# Patient Record
Sex: Female | Born: 1995 | Race: Black or African American | Hispanic: No | Marital: Single | Smoking: Never smoker
Health system: Southern US, Community
[De-identification: ages and names within clinical notes are randomized; demographics above are authoritative.]

## PROBLEM LIST (undated history)

## (undated) DIAGNOSIS — I313 Pericardial effusion (noninflammatory): Secondary | ICD-10-CM

## (undated) DIAGNOSIS — D571 Sickle-cell disease without crisis: Secondary | ICD-10-CM

## (undated) DIAGNOSIS — J45909 Unspecified asthma, uncomplicated: Secondary | ICD-10-CM

---
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
## 2016-06-11 ENCOUNTER — Encounter (HOSPITAL_COMMUNITY): Payer: Self-pay | Admitting: Emergency Medicine

## 2016-06-11 ENCOUNTER — Emergency Department (HOSPITAL_COMMUNITY): Payer: Medicaid Other

## 2016-06-11 ENCOUNTER — Emergency Department (HOSPITAL_COMMUNITY)
Admission: EM | Admit: 2016-06-11 | Discharge: 2016-06-11 | Disposition: A | Discharge status: Home / Self Care | Payer: Medicaid Other | Attending: Emergency Medicine | Admitting: Emergency Medicine

## 2016-06-11 DIAGNOSIS — Z79899 Other long term (current) drug therapy: Secondary | ICD-10-CM | POA: Diagnosis not present

## 2016-06-11 DIAGNOSIS — R6889 Other general symptoms and signs: Secondary | ICD-10-CM

## 2016-06-11 DIAGNOSIS — R509 Fever, unspecified: Secondary | ICD-10-CM

## 2016-06-11 DIAGNOSIS — J45909 Unspecified asthma, uncomplicated: Secondary | ICD-10-CM | POA: Insufficient documentation

## 2016-06-11 HISTORY — DX: Unspecified asthma, uncomplicated: J45.909

## 2016-06-11 HISTORY — DX: Sickle-cell disease without crisis: D57.1

## 2016-06-11 LAB — COMPREHENSIVE METABOLIC PANEL
ALT: 15 U/L (ref 14–54)
AST: 28 U/L (ref 15–41)
Albumin: 4.2 g/dL (ref 3.5–5.0)
Alkaline Phosphatase: 69 U/L (ref 38–126)
Anion gap: 10 (ref 5–15)
BUN: 7 mg/dL (ref 6–20)
CO2: 21 mmol/L — ABNORMAL LOW (ref 22–32)
Calcium: 9.3 mg/dL (ref 8.9–10.3)
Chloride: 106 mmol/L (ref 101–111)
Creatinine, Ser: 0.64 mg/dL (ref 0.44–1.00)
GFR calc Af Amer: >60 mL/min (ref >60)
GFR calc non Af Amer: >60 mL/min (ref >60)
Glucose, Bld: 93 mg/dL (ref 65–99)
Potassium: 3.9 mmol/L (ref 3.5–5.1)
Sodium: 137 mmol/L (ref 135–145)
Total Bilirubin: 3.8 mg/dL — ABNORMAL HIGH (ref 0.3–1.2)
Total Protein: 7.2 g/dL (ref 6.5–8.1)

## 2016-06-11 LAB — CBC WITH DIFFERENTIAL/PLATELET
Basophils Absolute: 0 10*3/uL (ref 0.0–0.1)
Basophils Relative: 0 %
Eosinophils Absolute: 0.2 10*3/uL (ref 0.0–0.7)
Eosinophils Relative: 1 %
HCT: 23.4 % — ABNORMAL LOW (ref 36.0–46.0)
Hemoglobin: 8.3 g/dL — ABNORMAL LOW (ref 12.0–15.0)
Lymphocytes Relative: 7 %
Lymphs Abs: 1.7 10*3/uL (ref 0.7–4.0)
MCH: 36.1 pg — ABNORMAL HIGH (ref 26.0–34.0)
MCHC: 35.5 g/dL (ref 30.0–36.0)
MCV: 101.7 fL — ABNORMAL HIGH (ref 78.0–100.0)
Monocytes Absolute: 1.2 10*3/uL — ABNORMAL HIGH (ref 0.1–1.0)
Monocytes Relative: 5 %
Neutro Abs: 20.6 10*3/uL — ABNORMAL HIGH (ref 1.7–7.7)
Neutrophils Relative %: 87 %
Platelets: 502 10*3/uL — ABNORMAL HIGH (ref 150–400)
RBC: 2.3 MIL/uL — ABNORMAL LOW (ref 3.87–5.11)
RDW: 22.7 % — ABNORMAL HIGH (ref 11.5–15.5)
WBC: 23.7 10*3/uL — ABNORMAL HIGH (ref 4.0–10.5)
nRBC: 11 /100 WBC — ABNORMAL HIGH

## 2016-06-11 LAB — RETICULOCYTES
RBC.: 2.3 MIL/uL — ABNORMAL LOW (ref 3.87–5.11)
Retic Count, Absolute: 450.8 10*3/uL — ABNORMAL HIGH (ref 19.0–186.0)
Retic Ct Pct: 19.6 % — ABNORMAL HIGH (ref 0.4–3.1)

## 2016-06-11 MED ORDER — SODIUM CHLORIDE 0.9 % IV BOLUS (SEPSIS)
1000.0000 mL | Freq: Once | INTRAVENOUS | Status: AC
  Administered 2016-06-11: 1000 mL via INTRAVENOUS

## 2016-06-11 MED ORDER — OXYCODONE-ACETAMINOPHEN 5-325 MG PO TABS
2.0000 | ORAL_TABLET | Freq: Once | ORAL | Status: AC
  Administered 2016-06-11: 2 via ORAL
  Filled 2016-06-11: qty 2

## 2016-06-11 MED ORDER — OSELTAMIVIR PHOSPHATE 75 MG PO CAPS: 75.0000 mg | ORAL_CAPSULE | Freq: Two times a day (BID) | ORAL | 0 refills | Status: DC

## 2016-06-11 MED ORDER — KETOROLAC TROMETHAMINE 15 MG/ML IJ SOLN
15.0000 mg | Freq: Once | INTRAMUSCULAR | Status: AC
  Administered 2016-06-11: 15 mg via INTRAVENOUS
  Filled 2016-06-11: qty 1

## 2016-06-11 NOTE — ED Notes (Signed)
PT did not have to use RR at this time  

## 2016-06-11 NOTE — ED Notes (Signed)
PT best friend at bedside

## 2016-06-11 NOTE — ED Triage Notes (Signed)
Aching fever chills and thinks she is having sickle crisis since this am [facility]  And given  2 tylenol less than 1 hours=ago for fever 102

## 2016-06-11 NOTE — ED Provider Notes (Signed)
[facility]-EMERGENCY DEPT Provider Note   CSN: [id number] Arrival date & time: 06/11/16  1237  By signing my name below, I, Sonum Patel, attest that this documentation has been prepared under the direction and in the presence of Raeford RazorStephen Kyrus Hyde, MD. Electronically Signed: Sonum Patel, Neurosurgeoncribe. 06/11/16. 3:01 PM.  History   Chief Complaint Chief Complaint  Patient presents with  . Fever  . Generalized Body Aches  . Sickle Cell Pain Crisis    The history is provided by the patient. No language interpreter was used.     HPI Comments: Lisa Tucker is a 21 y.o. female with past medical history of sickle cel anemia who presents to the Emergency Department complaining of gradual onset, constant, gradually worsened generalized body aches that began this morning. She has associated HA, fever (TMAX 102F), and cough productive of clear to light yellow sputum. She has take Tylenol for the fever but nothing for pain relief. She denies SOB, sore throat, urinary symptoms, joint swelling, rash, nausea, diarrhea. She is followed in [place] for her sickle cell condition; states her hemoglobin typically runs 7-9.   Past Medical History:  Diagnosis Date  . Asthma   . Sickle cell anemia (HCC)     There are no active problems to display for this patient.   No past surgical history on file.  OB History    No data available       Home Medications    Prior to Admission medications   Medication Sig Start Date End Date Taking? Authorizing Provider  albuterol (PROVENTIL HFA;VENTOLIN HFA) 108 (90 Base) MCG/ACT inhaler Inhale 1 puff into the lungs every 6 (six) hours as needed for wheezing or shortness of breath.   Yes Historical Provider, MD  fluticasone-salmeterol (ADVAIR HFA) 115-21 MCG/ACT inhaler Inhale 2 puffs into the lungs 2 (two) times daily.   Yes Historical Provider, MD  folic acid (FOLVITE) 1 MG tablet Take 1 mg by mouth daily.   Yes Historical Provider, MD  hydroxyurea (HYDREA) 500 MG capsule  Take 2,000-2,500 mg by mouth See admin instructions. Pt takes 4 caps (2000mg ) by mouth daily Monday-Friday, takes 5 caps (2500mg ) by mouth Saturday, Sunday.Marland Kitchen.Marland Kitchen.May take with food to minimize GI side effects.   Yes Historical Provider, MD  montelukast (SINGULAIR) 10 MG tablet Take 10 mg by mouth daily.   Yes Historical Provider, MD  penicillin v potassium (VEETID) 250 MG tablet Take 250 mg by mouth 2 (two) times daily.   Yes Historical Provider, MD    Family History No family history on file.  Social History Social History  Substance Use Topics  . Smoking status: Never Smoker  . Smokeless tobacco: Never Used  . Alcohol use Not on file     Allergies   Patient has no known allergies.   Review of Systems Review of Systems  A complete 10 system review of systems was obtained and all systems are negative except as noted in the HPI and PMH.    Physical Exam Updated Vital Signs BP 99/66 (BP Location: Right Arm)   Pulse 105   Temp 100.5 F (38.1 C) (Oral)   Resp 15   Ht 5\' 4"  (1.626 m)   Wt 165 lb 4 oz (75 kg)   LMP 06/11/2016   SpO2 97%   BMI 28.37 kg/m   Physical Exam  Constitutional: She is oriented to person, place, and time. She appears well-developed and well-nourished. No distress.  Non-toxic appearing   HENT:  Head: Normocephalic and atraumatic.  Eyes:  EOM are normal.  Neck: Normal range of motion.  Cardiovascular: Normal rate, regular rhythm and normal heart sounds.   Pulmonary/Chest: Effort normal and breath sounds normal.  Abdominal: Soft. She exhibits no distension. There is no tenderness.  Musculoskeletal: Normal range of motion.  Neurological: She is alert and oriented to person, place, and time.  Skin: Skin is warm and dry.  Psychiatric: She has a normal mood and affect. Judgment normal.  Nursing note and vitals reviewed.    ED Treatments / Results  DIAGNOSTIC STUDIES: Oxygen Saturation is 97% on RA, normal by my interpretation.    COORDINATION OF  CARE: 2:59 PM Discussed treatment plan with pt at bedside and pt agreed to plan.   Labs (all labs ordered are listed, but only abnormal results are displayed) Labs Reviewed  COMPREHENSIVE METABOLIC PANEL - Abnormal; Notable for the following:       Result Value   CO2 21 (*)    Total Bilirubin 3.8 (*)    All other components within normal limits  CBC WITH DIFFERENTIAL/PLATELET - Abnormal; Notable for the following:    WBC 23.7 (*)    RBC 2.30 (*)    Hemoglobin 8.3 (*)    HCT 23.4 (*)    MCV 101.7 (*)    MCH 36.1 (*)    RDW 22.7 (*)    Platelets 502 (*)    nRBC 11 (*)    Neutro Abs 20.6 (*)    Monocytes Absolute 1.2 (*)    All other components within normal limits  RETICULOCYTES - Abnormal; Notable for the following:    Retic Ct Pct 19.6 (*)    RBC. 2.30 (*)    Retic Count, Manual 450.8 (*)    All other components within normal limits    EKG  EKG Interpretation None       Radiology Dg Chest 2 View  Result Date: 06/11/2016 CLINICAL DATA:  Sickle cell disease.  Fever. EXAM: CHEST  2 VIEW COMPARISON:  None. FINDINGS: Heart is upper limits normal in size. Lungs are clear. No effusions or acute bony abnormality. IMPRESSION: No active cardiopulmonary disease. Electronically Signed   By: Charlett Nose M.D.   On: 06/11/2016 13:24    Procedures Procedures (including critical care time)  Medications Ordered in ED Medications - No data to display   Initial Impression / Assessment and Plan / ED Course  I have reviewed the triage vital signs and the nursing notes.  Pertinent labs & imaging results that were available during my care of the patient were reviewed by me and considered in my medical decision making (see chart for details).     Final Clinical Impressions(s) / ED Diagnoses   Final diagnoses:  Fever, unspecified fever cause  Flu-like symptoms    New Prescriptions New Prescriptions   No medications on file   I personally preformed the services scribed in my  presence. The recorded information has been reviewed is accurate. Raeford Razor, MD.    Raeford Razor, MD 06/24/16 [phone]

## 2016-06-11 NOTE — ED Notes (Addendum)
PT did not have to use RR at this time  

## 2017-01-03 ENCOUNTER — Emergency Department (HOSPITAL_COMMUNITY): Payer: Medicaid Other

## 2017-01-03 ENCOUNTER — Inpatient Hospital Stay (HOSPITAL_COMMUNITY)
Admission: EM | Admit: 2017-01-03 | Discharge: 2017-01-05 | DRG: 812 | Disposition: A | Discharge status: Home / Self Care | Payer: Medicaid Other | Attending: Internal Medicine | Admitting: Internal Medicine

## 2017-01-03 ENCOUNTER — Encounter (HOSPITAL_COMMUNITY): Payer: Self-pay | Admitting: *Deleted

## 2017-01-03 DIAGNOSIS — D638 Anemia in other chronic diseases classified elsewhere: Secondary | ICD-10-CM | POA: Diagnosis present

## 2017-01-03 DIAGNOSIS — Z79899 Other long term (current) drug therapy: Secondary | ICD-10-CM | POA: Diagnosis not present

## 2017-01-03 DIAGNOSIS — I313 Pericardial effusion (noninflammatory): Secondary | ICD-10-CM | POA: Diagnosis present

## 2017-01-03 DIAGNOSIS — D5700 Hb-SS disease with crisis, unspecified: Principal | ICD-10-CM | POA: Diagnosis present

## 2017-01-03 DIAGNOSIS — I3139 Other pericardial effusion (noninflammatory): Secondary | ICD-10-CM

## 2017-01-03 HISTORY — DX: Pericardial effusion (noninflammatory): I31.3

## 2017-01-03 LAB — COMPREHENSIVE METABOLIC PANEL
ALT: 18 U/L (ref 14–54)
AST: 42 U/L — ABNORMAL HIGH (ref 15–41)
Albumin: 4.2 g/dL (ref 3.5–5.0)
Alkaline Phosphatase: 63 U/L (ref 38–126)
Anion gap: 9 (ref 5–15)
BILIRUBIN TOTAL: 3.2 mg/dL — AB (ref 0.3–1.2)
BUN: 8 mg/dL (ref 6–20)
CHLORIDE: 107 mmol/L (ref 101–111)
CO2: 24 mmol/L (ref 22–32)
Calcium: 9.2 mg/dL (ref 8.9–10.3)
Creatinine, Ser: 0.49 mg/dL (ref 0.44–1.00)
Glucose, Bld: 93 mg/dL (ref 65–99)
POTASSIUM: 3.9 mmol/L (ref 3.5–5.1)
Sodium: 140 mmol/L (ref 135–145)
TOTAL PROTEIN: 7.6 g/dL (ref 6.5–8.1)

## 2017-01-03 LAB — URINALYSIS, ROUTINE W REFLEX MICROSCOPIC
BACTERIA UA: NONE SEEN
BILIRUBIN URINE: NEGATIVE
Glucose, UA: NEGATIVE mg/dL
Ketones, ur: NEGATIVE mg/dL
Leukocytes, UA: NEGATIVE
Nitrite: NEGATIVE
PH: 7 (ref 5.0–8.0)
Protein, ur: NEGATIVE mg/dL
SPECIFIC GRAVITY, URINE: 1.01 (ref 1.005–1.030)

## 2017-01-03 LAB — CBC WITH DIFFERENTIAL/PLATELET
Basophils Absolute: 0 10*3/uL (ref 0.0–0.1)
Basophils Relative: 0 %
EOS ABS: 1 10*3/uL — AB (ref 0.0–0.7)
Eosinophils Relative: 9 %
HCT: 20.5 % — ABNORMAL LOW (ref 36.0–46.0)
Hemoglobin: 7.8 g/dL — ABNORMAL LOW (ref 12.0–15.0)
LYMPHS ABS: 2.8 10*3/uL (ref 0.7–4.0)
Lymphocytes Relative: 26 %
MCH: 37 pg — AB (ref 26.0–34.0)
MCHC: 38 g/dL — AB (ref 30.0–36.0)
MCV: 97.2 fL (ref 78.0–100.0)
MONO ABS: 0.7 10*3/uL (ref 0.1–1.0)
Monocytes Relative: 6 %
NEUTROS ABS: 6.4 10*3/uL (ref 1.7–7.7)
Neutrophils Relative %: 59 %
PLATELETS: 382 10*3/uL (ref 150–400)
RBC: 2.11 MIL/uL — AB (ref 3.87–5.11)
RDW: 23.8 % — AB (ref 11.5–15.5)
WBC: 10.9 10*3/uL — AB (ref 4.0–10.5)
nRBC: 5 /100 WBC — ABNORMAL HIGH

## 2017-01-03 LAB — TROPONIN I: TROPONIN I: 0.03 ng/mL — AB (ref <0.03)

## 2017-01-03 LAB — RETICULOCYTES: RBC.: 2.11 MIL/uL — AB (ref 3.87–5.11)

## 2017-01-03 LAB — PREGNANCY, URINE: PREG TEST UR: NEGATIVE

## 2017-01-03 MED ORDER — KETOROLAC TROMETHAMINE 30 MG/ML IJ SOLN
30.0000 mg | INTRAMUSCULAR | Status: AC
  Administered 2017-01-03: 30 mg via INTRAVENOUS
  Filled 2017-01-03: qty 1

## 2017-01-03 MED ORDER — HYDROMORPHONE HCL 1 MG/ML IJ SOLN: 1.0000 mg | INTRAMUSCULAR | Status: DC

## 2017-01-03 MED ORDER — SENNOSIDES-DOCUSATE SODIUM 8.6-50 MG PO TABS
1.0000 | ORAL_TABLET | Freq: Two times a day (BID) | ORAL | Status: DC
  Administered 2017-01-03 – 2017-01-05 (×4): 1 via ORAL
  Filled 2017-01-03 (×4): qty 1

## 2017-01-03 MED ORDER — ONDANSETRON HCL 4 MG PO TABS: 4.0000 mg | ORAL_TABLET | ORAL | Status: DC | PRN

## 2017-01-03 MED ORDER — MONTELUKAST SODIUM 10 MG PO TABS
10.0000 mg | ORAL_TABLET | Freq: Every day | ORAL | Status: DC
  Administered 2017-01-04 – 2017-01-05 (×2): 10 mg via ORAL
  Filled 2017-01-03 (×2): qty 1

## 2017-01-03 MED ORDER — HYDROXYUREA 500 MG PO CAPS
2000.0000 mg | ORAL_CAPSULE | ORAL | Status: DC
  Administered 2017-01-04: 2000 mg via ORAL
  Filled 2017-01-03 (×2): qty 4

## 2017-01-03 MED ORDER — PENICILLIN V POTASSIUM 250 MG PO TABS
250.0000 mg | ORAL_TABLET | Freq: Two times a day (BID) | ORAL | Status: DC
  Administered 2017-01-04 – 2017-01-05 (×3): 250 mg via ORAL
  Filled 2017-01-03 (×3): qty 1

## 2017-01-03 MED ORDER — BECLOMETHASONE DIPROPIONATE 80 MCG/ACT IN AERS: 2.0000 | INHALATION_SPRAY | Freq: Two times a day (BID) | RESPIRATORY_TRACT | Status: DC

## 2017-01-03 MED ORDER — DIPHENHYDRAMINE HCL 50 MG/ML IJ SOLN
25.0000 mg | Freq: Once | INTRAMUSCULAR | Status: AC
  Administered 2017-01-03: 25 mg via INTRAVENOUS
  Filled 2017-01-03: qty 1

## 2017-01-03 MED ORDER — SODIUM CHLORIDE 0.45 % IV SOLN
INTRAVENOUS | Status: DC
  Administered 2017-01-03 – 2017-01-05 (×4): via INTRAVENOUS

## 2017-01-03 MED ORDER — HYDROMORPHONE HCL 1 MG/ML IJ SOLN
1.0000 mg | INTRAMUSCULAR | Status: AC
  Administered 2017-01-03: 0.5 mg via INTRAVENOUS
  Filled 2017-01-03: qty 1

## 2017-01-03 MED ORDER — FOLIC ACID 1 MG PO TABS
1.0000 mg | ORAL_TABLET | Freq: Every day | ORAL | Status: DC
  Administered 2017-01-04 – 2017-01-05 (×2): 1 mg via ORAL
  Filled 2017-01-03 (×2): qty 1

## 2017-01-03 MED ORDER — POLYETHYLENE GLYCOL 3350 17 G PO PACK: 17.0000 g | PACK | Freq: Every day | ORAL | Status: DC | PRN

## 2017-01-03 MED ORDER — HYDROMORPHONE HCL 1 MG/ML IJ SOLN: 1.0000 mg | INTRAMUSCULAR | Status: AC

## 2017-01-03 MED ORDER — MORPHINE SULFATE (PF) 2 MG/ML IV SOLN
2.0000 mg | INTRAVENOUS | Status: DC | PRN
  Administered 2017-01-04: 2 mg via INTRAVENOUS
  Filled 2017-01-03: qty 1

## 2017-01-03 MED ORDER — BUDESONIDE 0.5 MG/2ML IN SUSP
0.5000 mg | Freq: Two times a day (BID) | RESPIRATORY_TRACT | Status: DC
  Administered 2017-01-03 – 2017-01-05 (×4): 0.5 mg via RESPIRATORY_TRACT
  Filled 2017-01-03 (×4): qty 2

## 2017-01-03 MED ORDER — ONDANSETRON HCL 4 MG/2ML IJ SOLN: 4.0000 mg | INTRAMUSCULAR | Status: DC | PRN

## 2017-01-03 MED ORDER — DEXTROSE 5 % IV SOLN
1.0000 g | Freq: Once | INTRAVENOUS | Status: AC
  Administered 2017-01-03: 1 g via INTRAVENOUS
  Filled 2017-01-03: qty 10

## 2017-01-03 MED ORDER — HYDROMORPHONE HCL 1 MG/ML IJ SOLN: 0.5000 mg | INTRAMUSCULAR | Status: AC

## 2017-01-03 MED ORDER — ALBUTEROL SULFATE (2.5 MG/3ML) 0.083% IN NEBU: 3.0000 mL | INHALATION_SOLUTION | Freq: Four times a day (QID) | RESPIRATORY_TRACT | Status: DC | PRN

## 2017-01-03 MED ORDER — HYDROMORPHONE HCL 1 MG/ML IJ SOLN
0.5000 mg | INTRAMUSCULAR | Status: AC
  Administered 2017-01-03: 0.5 mg via INTRAVENOUS
  Filled 2017-01-03: qty 1

## 2017-01-03 MED ORDER — KETOROLAC TROMETHAMINE 30 MG/ML IJ SOLN
30.0000 mg | Freq: Four times a day (QID) | INTRAMUSCULAR | Status: DC
  Administered 2017-01-03 – 2017-01-05 (×5): 30 mg via INTRAVENOUS
  Filled 2017-01-03 (×5): qty 1

## 2017-01-03 MED ORDER — HYDROXYUREA 500 MG PO CAPS: 2500.0000 mg | ORAL_CAPSULE | ORAL | Status: DC

## 2017-01-03 MED ORDER — ENOXAPARIN SODIUM 40 MG/0.4ML ~~LOC~~ SOLN
40.0000 mg | Freq: Every day | SUBCUTANEOUS | Status: DC
  Filled 2017-01-03 (×2): qty 0.4

## 2017-01-03 MED ORDER — ONDANSETRON HCL 4 MG/2ML IJ SOLN
4.0000 mg | INTRAMUSCULAR | Status: DC | PRN
  Administered 2017-01-03: 4 mg via INTRAVENOUS
  Filled 2017-01-03: qty 2

## 2017-01-03 NOTE — H&P (Signed)
History and Physical    Lisa Tucker ZOX:[id number] DOB: 05-19-1995 DOA: 01/03/2017  PCP: Alphonse Guildabarrus, Brian Ray, [place] Consultants:  Ronette DeterMcMahon - [facility] [place]; [place] - pul[place]ology; Adriana [place] - cardiology Patient coming from: [employer] [employer] (Student); NOK: Mother, [phone]  Chief Complaint: sickle cell crisis  HPI: Lisa Tucker (goes by "Lisa Tucker") is a 21 y.o. female with medical history significant of sickle cell anemia (SS disease), remote pericardial effusion, and asthma presenting because she was feeling very lethargic, weak, sore, congested.  Some greenish mucus that has turned clear.  Has felt like she was fighting off some kind of infection and like she was going to have a crisis.  Symptoms started yesterday evening.  A typical pain crisis involves a very sharp pain in her lower back, unbearable; crises are also with fever, chills, weakness, fatigue, cough, sneezing, rhinorrhea.  She does not have the pain in her back now but she does have mild joint pain.  She does have a h/o acute chest and sepsis, takes PCN for prohylaxis.  She has worn a Holter in 2015 for pericardial effusion, followed up every 6 months but last f/u was in 2016.  Last Echo was in 2015-2016.  No chest pain, no SOB.  Cough started in the ER and a little bit before that.  Last crisis was in Feb 2018, hospitalizaed [place].  For her usual crisis, she is typically treated with Rocephin and given prn pain medication; she has never needed a PCA and does not take opiates on an ongoing basis.   ED Course: Sickle cell crisis but little pain.  Intermittent cough.  CXR with enlargement of pericardial silhouette and she does have a h/o small pericardial effusion.  Troponin 0.03.  Dr. Mayford Knifeurner called and suggests trending troponin and checking Echo in AM.  Given Rocephin x 1 at patient request for cough.  Review of Systems: As per HPI; otherwise review of systems reviewed and negative.   Ambulatory Status:   Ambulates without assistance  Past Medical History:  Diagnosis Date  . Asthma   . Pericardial effusion 2015  . Sickle cell anemia (HCC)    SS disease; has required [place] O2 for several months twice in the past (2013, 2017)    Past Surgical History:  Procedure Laterality Date  . APPENDECTOMY  2011    Social History   Social History  . Marital status: Single    Spouse name: N/A  . Number of children: N/A  . Years of education: N/A   Occupational History  . College student    Social History Main Topics  . Smoking status: Never Smoker  . Smokeless tobacco: Never Used  . Alcohol use No  . Drug use: No  . Sexual activity: Not on file   Other Topics Concern  . Not on file   Social History Narrative  . No narrative on file    No Known Allergies  Family History  Problem Relation Age of Onset  . Hypertension Mother     Prior to Admission medications   Medication Sig Start Date End Date Taking? Authorizing Provider  albuterol (PROVENTIL HFA;VENTOLIN HFA) 108 (90 Base) MCG/ACT inhaler Inhale 1 puff into the lungs every 6 (six) hours as needed for wheezing or shortness of breath.   Yes [provider]  beclomethasone (QVAR) 80 MCG/ACT inhaler Inhale 2 puffs into the lungs 2 (two) times daily.   Yes [provider]  etonogestrel (NEXPLANON) 68 MG IMPL implant 1  each by Subdermal route once.   Yes [provider]  folic acid (FOLVITE) 1 MG tablet Take 1 mg by mouth daily.   Yes [provider]  hydroxyurea (HYDREA) 500 MG capsule Take 2,000-2,500 mg by mouth See admin instructions. Pt takes 4 caps (2000mg ) by mouth daily Monday-Friday, takes 5 caps (2500mg ) by mouth Saturday, Sunday.Marland KitchenMarland KitchenMay take with food to minimize GI side effects.   Yes [provider]  montelukast (SINGULAIR) 10 MG tablet Take 10 mg by mouth daily.   Yes [provider]  penicillin v potassium (VEETID) 250 MG tablet Take 250 mg by mouth 2 (two) times daily.    Yes [provider]  VITAMIN D, CHOLECALCIFEROL, PO Take 1 tablet by mouth daily. OTC, strength unknown   Yes [provider]  oseltamivir (TAMIFLU) 75 MG capsule Take 1 capsule (75 mg total) by mouth every 12 (twelve) hours. Patient not taking: Reported on 01/03/2017 06/11/16   Raeford Razor, MD    Physical Exam: Vitals:   01/03/17 1930 01/03/17 2000 01/03/17 2055 01/03/17 2233  BP: (!) 103/50 (!) 96/50 (!) 101/53 97/68  Pulse: 66 73 63 63  Resp: 18 (!) 35 16 (!) 21  Temp:      TempSrc:      SpO2: 96% 96% 96% 99%  Weight:      Height:         General:  Appears calm and comfortable and is NAD Eyes:  PERRL, EOMI, normal lids, iris ENT:  grossly normal hearing, lips & tongue, mmm; appropriate dentition Neck:  no LAD, masses or thyromegaly; no carotid bruits Cardiovascular:  RRR, no m/r/g. No LE edema.  Respiratory:   CTA bilaterally with no wheezes/rales/rhonchi.  Normal respiratory effort. Abdomen:  soft, NT, ND, NABS Back:   normal alignment, no CVAT Skin:  no rash or induration seen on limited exam Musculoskeletal:  grossly normal tone BUE/BLE, good ROM, no bony abnormality Lower extremity:  No LE edema.  Limited foot exam with no ulcerations.  2+ distal pulses. Psychiatric:  grossly normal mood and affect, speech fluent and appropriate, AOx3 Neurologic:  CN 2-12 grossly intact, moves all extremities in coordinated fashion, sensation intact    Radiological Exams on Admission: Dg Chest 2 View  Result Date: 01/03/2017 CLINICAL DATA:  Cough, sickle cell anemia EXAM: CHEST  2 VIEW COMPARISON:  06/11/2016 chest radiograph. FINDINGS: New enlargement of the cardiopericardial silhouette. Otherwise normal mediastinal contour. No pneumothorax. No pleural effusion. No acute consolidative airspace disease. No overt pulmonary edema. Visualized osseous structures appear intact. IMPRESSION: New enlargement of the cardiopericardial silhouette, cannot exclude a pericardial  effusion. No active pulmonary disease. Electronically Signed   By: Delbert Phenix M.D.   On: 01/03/2017 17:37    EKG: Independently reviewed.  NSR with rate 76; prolonged QT 505; no evidence of acute ischemia   Labs on Admission: I have personally reviewed the available labs and imaging studies at the time of the admission.  Pertinent labs:   Bilirubin 3.2, prior 3.8 in 2/18 Troponin 0.03 WBC 10.9 Hgb 7.8, prior 8.3 in 2/18 Retic >23 Upreg negative UA small Hgb and otherwise WNL  Assessment/Plan Principal Problem:   Sickle cell crisis (HCC) Active Problems:   Pericardial effusion   Sickle cell crisis -Patient does not present with severe pain with her typical pain crisis and instead often presents with evidence of infection. -Currently with minimally elevated WBC count but no fever and negative CXR and UA so currently low suspicion for infection. -  She was given Rocephin x 1 in the ER but will not continue at this time.   -Hgb still in 7-8 range so there is no indication for transfusion at this time. - Admit to tele bed given probable pericardial effusion (see below). -Start Toradol and prn morphine for pain -continue folic acid and hydroxyurea -IVF: D5-1/2NS at 150 cc/h -Zofran for nausea -If she develops fever or worsening cough, consider blood cultures, repeat CXR, and/or empiric Rocephin. -For now will continue prophylactic PCN.  Pericardial effusion -Patient with reported h/o small effusion in 2016. -She was last seen by cardiology in 7/17 but the actual note is not available in Epic at this time. -CXR today demonstrates possible recurrent enlarged effusion. -Does not demonstrate tamponade physiology-  No tachycardia, borderline hypotension but this is consistent with her BP during her prior ER evaluation in 2/18, no distant heart sounds, no JVP, no pulses paradoxus. -Troponin 0.03. -Dr. Mayford Knife was consulted and suggests trending troponin and having Echo tomorrow. -She  may benefit from cardiology consult after that.  DVT prophylaxis:  Lovenox  Code Status:  Full - confirmed with patient/family Family Communication: Mother present throughout evaluation Disposition Plan:  Home once clinically improved Consults called: Cardiology by telephone  Admission status: Admit - It is my clinical opinion that admission to INPATIENT is reasonable and necessary because this patient will require at least 2 midnights in the hospital to treat this condition based on the medical complexity of the problems presented.  Given the aforementioned information, the predictability of an adverse outcome is felt to be significant.    Jonah Blue MD [facility]  If note is complete, please contact covering daytime or nighttime physician. [email or web address] Password [email or web address]  01/03/2017, 11:26 PM

## 2017-01-03 NOTE — ED Triage Notes (Signed)
Patient is alert and oriented x4.  She is complaining of sickle cell crisis.  Patient states that she presents different than most sickle cell patient.  Patient states that she is feeling weak and lethargic.  Patient denies any pain.

## 2017-01-03 NOTE — ED Notes (Signed)
Pickering MD made aware. Only 0.5mg  given per pt request.

## 2017-01-03 NOTE — ED Provider Notes (Signed)
[facility]-EMERGENCY DEPT Provider Note   CSN: [id number] Arrival date & time: 01/03/17  1350     History   Chief Complaint Chief Complaint  Patient presents with  . Sickle Cell Pain Crisis    HPI Lisa Tucker is a 21 y.o. female.  HPI Patient has sickle cell [place]. Flares up around twice a year. Says that she thinks she is flaring up now due to the change in weather. States she hurts mostly in her joints. Has not had fever. Slight URI symptoms. No nausea vomiting. States she aches in her joints. No chest pain. States she has some weakness and fatigue. Also has a dull headache. She is on hydroxyurea. She has oxycodone that she takes as needed home and normally does not have to take. Denies possibility of pregnancy. States that she has had good oral intake at home his been trying to drink a lot of water. Past Medical History:  Diagnosis Date  . Asthma   . Sickle cell anemia (HCC)     There are no active problems to display for this patient.   Past Surgical History:  Procedure Laterality Date  . APPENDECTOMY      OB History    No data available       Home Medications    Prior to Admission medications   Medication Sig Start Date End Date Taking? Authorizing Provider  albuterol (PROVENTIL HFA;VENTOLIN HFA) 108 (90 Base) MCG/ACT inhaler Inhale 1 puff into the lungs every 6 (six) hours as needed for wheezing or shortness of breath.   Yes [provider]  beclomethasone (QVAR) 80 MCG/ACT inhaler Inhale 2 puffs into the lungs 2 (two) times daily.   Yes [provider]  etonogestrel (NEXPLANON) 68 MG IMPL implant 1 each by Subdermal route once.   Yes [provider]  folic acid (FOLVITE) 1 MG tablet Take 1 mg by mouth daily.   Yes [provider]  hydroxyurea (HYDREA) 500 MG capsule Take 2,000-2,500 mg by mouth See admin instructions. Pt takes 4 caps (2000mg ) by mouth daily Monday-Friday, takes 5 caps (2500mg ) by mouth Saturday, Sunday.Marland KitchenMarland KitchenMay take with  food to minimize GI side effects.   Yes [provider]  montelukast (SINGULAIR) 10 MG tablet Take 10 mg by mouth daily.   Yes [provider]  penicillin v potassium (VEETID) 250 MG tablet Take 250 mg by mouth 2 (two) times daily.   Yes [provider]  VITAMIN D, CHOLECALCIFEROL, PO Take 1 tablet by mouth daily. OTC, strength unknown   Yes [provider]  oseltamivir (TAMIFLU) 75 MG capsule Take 1 capsule (75 mg total) by mouth every 12 (twelve) hours. Patient not taking: Reported on 01/03/2017 06/11/16   Raeford Razor, MD    Family History History reviewed. No pertinent family history.  Social History Social History  Substance Use Topics  . Smoking status: Never Smoker  . Smokeless tobacco: Never Used  . Alcohol use No     Allergies   Patient has no known allergies.   Review of Systems Review of Systems  Constitutional: Positive for appetite change and fatigue. Negative for fever.  HENT: Positive for congestion.   Respiratory: Positive for cough. Negative for chest tightness.   Cardiovascular: Negative for chest pain.  Gastrointestinal: Negative for abdominal pain.  Genitourinary: Negative for flank pain.  Musculoskeletal: Positive for arthralgias. Negative for back pain.  Skin: Negative for rash.  Neurological: Negative for tremors.  Hematological: Negative for adenopathy.  Psychiatric/Behavioral: Negative for confusion.  Physical Exam Updated Vital Signs BP (!) 103/50   Pulse 66   Temp 98.6 F (37 C) (Oral)   Resp 18   Ht 5\' 4"  (1.626 m)   Wt 74.8 kg (165 lb)   LMP 12/20/2016   SpO2 96%   BMI 28.32 kg/m   Physical Exam  Constitutional: She appears well-developed and well-nourished.  HENT:  Head: Atraumatic.  Neck: Neck supple.  Cardiovascular: Normal rate.   Pulmonary/Chest: Effort normal. She has no wheezes. She has no rales.  Abdominal: Soft. There is no tenderness.  Musculoskeletal: She exhibits no edema.    Neurological: She is alert.  Skin: Skin is warm. Capillary refill takes less than 2 seconds.  Psychiatric: She has a normal mood and affect.     ED Treatments / Results  Labs (all labs ordered are listed, but only abnormal results are displayed) Labs Reviewed  CBC WITH DIFFERENTIAL/PLATELET - Abnormal; Notable for the following:       Result Value   WBC 10.9 (*)    RBC 2.11 (*)    Hemoglobin 7.8 (*)    HCT 20.5 (*)    MCH 37.0 (*)    MCHC 38.0 (*)    RDW 23.8 (*)    All other components within normal limits  COMPREHENSIVE METABOLIC PANEL - Abnormal; Notable for the following:    AST 42 (*)    Total Bilirubin 3.2 (*)    All other components within normal limits  RETICULOCYTES - Abnormal; Notable for the following:    Retic Ct Pct >23.0 (*)    RBC. 2.11 (*)    All other components within normal limits  URINALYSIS, ROUTINE W REFLEX MICROSCOPIC - Abnormal; Notable for the following:    Hgb urine dipstick SMALL (*)    Squamous Epithelial / LPF 0-5 (*)    All other components within normal limits  TROPONIN I - Abnormal; Notable for the following:    Troponin I 0.03 (*)    All other components within normal limits  PREGNANCY, URINE    EKG  EKG Interpretation  Date/Time:  Sunday January 03 2017 18:20:57 EDT Ventricular Rate:  76 PR Interval:    QRS Duration: 100 QT Interval:  449 QTC Calculation: 505 R Axis:   83 Text Interpretation:  Sinus rhythm Prolonged QT interval Confirmed by Benjiman Core [phone]) on 01/03/2017 7:42:30 PM       Radiology Dg Chest 2 View  Result Date: 01/03/2017 CLINICAL DATA:  Cough, sickle cell anemia EXAM: CHEST  2 VIEW COMPARISON:  06/11/2016 chest radiograph. FINDINGS: New enlargement of the cardiopericardial silhouette. Otherwise normal mediastinal contour. No pneumothorax. No pleural effusion. No acute consolidative airspace disease. No overt pulmonary edema. Visualized osseous structures appear intact. IMPRESSION: New enlargement of  the cardiopericardial silhouette, cannot exclude a pericardial effusion. No active pulmonary disease. Electronically Signed   By: Delbert Phenix M.D.   On: 01/03/2017 17:37    Procedures Procedures (including critical care time)  Medications Ordered in ED Medications  0.45 % sodium chloride infusion ( Intravenous New Bag/Given 01/03/17 1723)  HYDROmorphone (DILAUDID) injection 1 mg (0 mg Intravenous Hold 01/03/17 1911)    Or  HYDROmorphone (DILAUDID) injection 1 mg ( Subcutaneous See Alternative 01/03/17 1911)  HYDROmorphone (DILAUDID) injection 1 mg (not administered)    Or  HYDROmorphone (DILAUDID) injection 1 mg (not administered)  ondansetron (ZOFRAN) injection 4 mg (4 mg Intravenous Given 01/03/17 1719)  cefTRIAXone (ROCEPHIN) 1 g in dextrose 5 % 50 mL IVPB (not administered)  ketorolac (TORADOL) 30 MG/ML injection 30 mg (30 mg Intravenous Given 01/03/17 1719)  HYDROmorphone (DILAUDID) injection 0.5 mg (0.5 mg Intravenous Given 01/03/17 1719)    Or  HYDROmorphone (DILAUDID) injection 0.5 mg ( Subcutaneous See Alternative 01/03/17 1719)  HYDROmorphone (DILAUDID) injection 1 mg (0.5 mg Intravenous Given 01/03/17 1817)    Or  HYDROmorphone (DILAUDID) injection 1 mg ( Subcutaneous See Alternative 01/03/17 1817)  diphenhydrAMINE (BENADRYL) injection 25 mg (25 mg Intravenous Given 01/03/17 1738)     Initial Impression / Assessment and Plan / ED Course  I have reviewed the triage vital signs and the nursing notes.  Pertinent labs & imaging results that were available during my care of the patient were reviewed by me and considered in my medical decision making (see chart for details).     Patient with sickle cell pain crisis. Presents with joint pain which is sometimes her symptoms but not most common symptoms. Has had occasional cough but no real chest pain. Does have on the x-ray and new enlargement of her pericardial silhouette. Has had previous pericardial effusion but his been small. Last x-ray we  have here was from 7 months ago. Troponin is minimally elevated at 0.03. Discussed with Dr. Mayford Knifeurner from cardiology. Thinks that the patient would benefit from serial cardiac enzymes and cardiac echo tomorrow. Patient has had a cough without real sputum production but oriented acute chest. Requested Rocephin) think it is reasonable. Will admit to hospitalist. patient is worried about pneumonia  Final Clinical Impressions(s) / ED Diagnoses   Final diagnoses:  Sickle cell pain crisis (HCC)  Pericardial effusion    New Prescriptions New Prescriptions   No medications on file     Benjiman CorePickering, Mckinzey Entwistle, MD 01/03/17 2012

## 2017-01-03 NOTE — Progress Notes (Addendum)
Patient arrived to floor from Emergency Room.  Patient was settled in bed.  She had no complaints and vitals/condition was stable.  About 45 minutes after patient arrived to floor, telemetry orders were placed.  Will give report to tele floor once patient has a bed. Priscille KluverLineback, Maleena Eddleman M

## 2017-01-04 ENCOUNTER — Inpatient Hospital Stay (HOSPITAL_COMMUNITY): Payer: Medicaid Other

## 2017-01-04 DIAGNOSIS — I313 Pericardial effusion (noninflammatory): Secondary | ICD-10-CM

## 2017-01-04 DIAGNOSIS — D5700 Hb-SS disease with crisis, unspecified: Principal | ICD-10-CM

## 2017-01-04 DIAGNOSIS — D638 Anemia in other chronic diseases classified elsewhere: Secondary | ICD-10-CM

## 2017-01-04 LAB — TROPONIN I: Troponin I: <0.03 ng/mL (ref <0.03)

## 2017-01-04 LAB — CBC
HCT: 16.8 % — ABNORMAL LOW (ref 36.0–46.0)
Hemoglobin: 6.5 g/dL — CL (ref 12.0–15.0)
MCH: 36.9 pg — ABNORMAL HIGH (ref 26.0–34.0)
MCHC: 38.7 g/dL — ABNORMAL HIGH (ref 30.0–36.0)
MCV: 95.5 fL (ref 78.0–100.0)
Platelets: 286 10*3/uL (ref 150–400)
RBC: 1.76 MIL/uL — AB (ref 3.87–5.11)
RDW: 23.2 % — AB (ref 11.5–15.5)
WBC: 10.1 10*3/uL (ref 4.0–10.5)

## 2017-01-04 LAB — RETICULOCYTES: RBC.: 1.74 MIL/uL — ABNORMAL LOW (ref 3.87–5.11)

## 2017-01-04 LAB — DIFFERENTIAL
Basophils Absolute: 0.1 10*3/uL (ref 0.0–0.1)
Basophils Relative: 1 %
Eosinophils Absolute: 1.1 10*3/uL — ABNORMAL HIGH (ref 0.0–0.7)
Eosinophils Relative: 11 %
LYMPHS ABS: 2.6 10*3/uL (ref 0.7–4.0)
Lymphocytes Relative: 26 %
MONOS PCT: 5 %
Monocytes Absolute: 0.5 10*3/uL (ref 0.1–1.0)
NEUTROS ABS: 5.8 10*3/uL (ref 1.7–7.7)
NEUTROS PCT: 57 %

## 2017-01-04 LAB — ECHOCARDIOGRAM COMPLETE
HEIGHTINCHES: 64 in
Weight: 2673.74 oz

## 2017-01-04 LAB — ABO/RH: ABO/RH(D): O POS

## 2017-01-04 LAB — PREPARE RBC (CROSSMATCH)

## 2017-01-04 LAB — MRSA PCR SCREENING: MRSA BY PCR: NEGATIVE

## 2017-01-04 MED ORDER — SODIUM CHLORIDE 0.9 % IV SOLN
Freq: Once | INTRAVENOUS | Status: AC
  Administered 2017-01-04: 12:00:00 via INTRAVENOUS

## 2017-01-04 MED ORDER — ASPIRIN-ACETAMINOPHEN-CAFFEINE 250-250-65 MG PO TABS
1.0000 | ORAL_TABLET | Freq: Four times a day (QID) | ORAL | Status: DC | PRN
  Administered 2017-01-04 – 2017-01-05 (×2): 1 via ORAL
  Filled 2017-01-04 (×3): qty 1

## 2017-01-04 NOTE — Progress Notes (Signed)
SICKLE CELL SERVICE PROGRESS NOTE  Lisa Tucker UJW:[id number] DOB: 1995/12/09 DOA: 01/03/2017 PCP: Alphonse Guildabarrus, Brian Ray, MD  Assessment/Plan: Principal Problem:   Sickle cell crisis [facility](HCC) Active Problems:   Pericardial effusion  1. Hb SS with Vaso-occlusive Crisis: Pt is without pain at present. Will continue Toradol and prescribe Oxycodone on a PRN basis. Continue IVF at current rate. 2. Migraine HA: Resolved with Excedrin.  3. Anemia of Chronic Disease:baseline Hb is 8 g/dl. The decrease in Hb is likely from dilution and she has a robust reticulocytosis. She received a transfusion of 1 unit RBC's ordered overnight by Dr. Alvino Chapelhoi.   No indication for further transfusion at this time. Continue Hydrea.  4. Enlarged Cardiac Silhouette: Pt is asymptomatic and has no clinical indication that she has an increased pericardial effusion. ECHO completed and results pending at this time. No clinical or EKG signs of pericarditis or Tamponade.  5. H/O Asthma: quiescent at present. 6. DVT Prophylaxis: Lovenox   Code Status: Full Code Family Communication: Mother at bedside and all questions answered.  Disposition Plan: Not yet ready for discharge  Robby Bulkley A.  Pager [phone]. If 7PM-7AM, please contact night-coverage.  01/04/2017, 2:13 PM  LOS: 1 day   Interim History: Pt denies any pain at this time. She has had no SOB or DOE and denies having any chest pain.  She did c/o HA ans was treated with Excedrin Migraine after I spoke with her mother who confirmed that all her TCD's were normal in the past. HA is resolved at present.   Consultants:  None  Procedures:  None  Antibiotics:  Rocephin 9/2 >>9/2    Objective: Vitals:   01/04/17 0732 01/04/17 0758 01/04/17 1120 01/04/17 1137  BP:  (!) 108/57 (!) 113/57 (!) 111/52  Pulse:  77 81 78  Resp:  16 16 16   Temp:  98.6 F (37 C) 98.4 F (36.9 C) 98.7 F (37.1 C)  TempSrc:  Oral Oral Oral  SpO2: 98% 99% 98% 98%  Weight:       Height:       Weight change:   Intake/Output Summary (Last 24 hours) at 01/04/17 1413 Last data filed at 01/03/17 2203  Gross per 24 hour  Intake               50 ml  Output                0 ml  Net               50 ml     Physical Exam General: Alert, awake, oriented x3, in no acute distress. Well appearing. HEENT: [place]/AT PEERL, EOMI, anicteric Neck: Trachea midline,  no masses, no thyromegal,y no JVD, no carotid bruit OROPHARYNX:  Moist, No exudate/ erythema/lesions.  Heart: Regular rate and rhythm, no murmurs, rubs, gallops, PMI non-displaced, no heaves or thrills on palpation.  Lungs: Clear to auscultation, no wheezing or rhonchi noted. No increased vocal fremitus resonant to percussion  Abdomen: Soft, nontender, nondistended, positive bowel sounds, no masses no hepatosplenomegaly noted..  Neuro: No focal neurological deficits noted cranial nerves II through XII grossly intact. Strength at functional baseline in bilateral upper and lower extremities. Musculoskeletal: No warmth swelling or erythema around joints, no spinal tenderness noted. Psychiatric: Patient alert and oriented x3, good insight and cognition, good recent to remote recall. Lymph node survey: No cervical axillary or inguinal lymphadenopathy noted.    Data Reviewed: Basic Metabolic Panel:  Recent Labs Lab 01/03/17 1720  NA 140  K 3.9  CL 107  CO2 24  GLUCOSE 93  BUN 8  CREATININE 0.49  CALCIUM 9.2   Liver Function Tests:  Recent Labs Lab 01/03/17 1720  AST 42*  ALT 18  ALKPHOS 63  BILITOT 3.2*  PROT 7.6  ALBUMIN 4.2   No results for input(s): LIPASE, AMYLASE in the last 168 hours. No results for input(s): AMMONIA in the last 168 hours. CBC:  Recent Labs Lab 01/03/17 1720 01/04/17 0636  WBC 10.9* 10.1  NEUTROABS 6.4 5.8  HGB 7.8* 6.5*  HCT 20.5* 16.8*  MCV 97.2 95.5  PLT 382 286   Cardiac Enzymes:  Recent Labs Lab 01/03/17 1720 01/04/17 0024 01/04/17 0636  TROPONINI 0.03*  <0.03 <0.03   BNP (last 3 results) No results for input(s): BNP in the last 8760 hours.  ProBNP (last 3 results) No results for input(s): PROBNP in the last 8760 hours.  CBG: No results for input(s): GLUCAP in the last 168 hours.  Recent Results (from the past 240 hour(s))  MRSA PCR Screening     Status: None   Collection Time: 01/03/17 11:13 PM  Result Value Ref Range Status   MRSA by PCR NEGATIVE NEGATIVE Final    Comment:        The GeneXpert MRSA Assay (FDA approved for NASAL specimens only), is one component of a comprehensive MRSA colonization surveillance program. It is not intended to diagnose MRSA infection nor to guide or monitor treatment for MRSA infections.      Studies: Dg Chest 2 View  Result Date: 01/03/2017 CLINICAL DATA:  Cough, sickle cell anemia EXAM: CHEST  2 VIEW COMPARISON:  06/11/2016 chest radiograph. FINDINGS: New enlargement of the cardiopericardial silhouette. Otherwise normal mediastinal contour. No pneumothorax. No pleural effusion. No acute consolidative airspace disease. No overt pulmonary edema. Visualized osseous structures appear intact. IMPRESSION: New enlargement of the cardiopericardial silhouette, cannot exclude a pericardial effusion. No active pulmonary disease. Electronically Signed   By: Delbert Phenix M.D.   On: 01/03/2017 17:37    Scheduled Meds: . budesonide (PULMICORT) nebulizer solution  0.5 mg Nebulization BID  . enoxaparin (LOVENOX) injection  40 mg Subcutaneous QHS  . folic acid  1 mg Oral Daily  . hydroxyurea  2,000 mg Oral Once per day on Mon Tue Wed Thu Fri  . [START ON 01/09/2017] hydroxyurea  2,500 mg Oral Once per day on Sun Sat  . ketorolac  30 mg Intravenous Q6H  . montelukast  10 mg Oral Daily  . penicillin v potassium  250 mg Oral BID  . senna-docusate  1 tablet Oral BID   Continuous Infusions: . sodium chloride 75 mL/hr at 01/04/17 1122    Principal Problem:   Sickle cell crisis (HCC) Active Problems:    Pericardial effusion   In excess of 35 minutes spent during this visit. Greater than 50% involved face to face contact with the patient for assessment, counseling and coordination of care.

## 2017-01-04 NOTE — Progress Notes (Signed)
CRITICAL VALUE ALERT  Critical Value:  Hemoglobin 6.5  Date & Time Notied: 9/3 0730  Provider Notified: Dr. Alvino Chapelhoi  Orders Received/Actions taken: 1 unit prbc's

## 2017-01-04 NOTE — Care Management Note (Signed)
Case Management Note  Patient Details  Name: Lisa Tucker MRN: [id number] Date of Birth: 1995/05/30  Subjective/Objective:      Sickle cell crisis              Action/Plan: Date:  January 04, 2017 Chart reviewed for concurrent status and case management needs. Will continue to follow patient progress. Discharge Planning: following for needs Expected discharge date: [id number] Marcelle SmilingRhonda Davis, BSN, [place]3, [place]   [phone]  Expected Discharge Date:   (unknown)               Expected Discharge Plan:  Home/Self Care  In-House Referral:     Discharge planning Services  CM Consult  Post Acute Care Choice:    Choice offered to:     DME Arranged:    DME Agency:     HH Arranged:    HH Agency:     Status of Service:  In process, will continue to follow  If discussed at Long Length of Stay Meetings, dates discussed:    Additional Comments:  Golda AcreDavis, Rhonda Lynn, RN 01/04/2017, 9:04 AM

## 2017-01-04 NOTE — Progress Notes (Signed)
  Echocardiogram 2D Echocardiogram has been performed.  Nolon RodBrown, Tony 01/04/2017, 10:15 AM

## 2017-01-05 ENCOUNTER — Encounter: Payer: Self-pay | Admitting: Internal Medicine

## 2017-01-05 LAB — RETICULOCYTES
RBC.: 2.28 MIL/uL — ABNORMAL LOW (ref 3.87–5.11)
Retic Ct Pct: >23 % — ABNORMAL HIGH (ref 0.4–3.1)

## 2017-01-05 LAB — TYPE AND SCREEN
ABO/RH(D): O POS
Antibody Screen: NEGATIVE
UNIT DIVISION: 0

## 2017-01-05 LAB — CBC WITH DIFFERENTIAL/PLATELET
BASOS PCT: 1 %
Basophils Absolute: 0.1 10*3/uL (ref 0.0–0.1)
EOS PCT: 12 %
Eosinophils Absolute: 1.1 10*3/uL — ABNORMAL HIGH (ref 0.0–0.7)
HEMATOCRIT: 20.9 % — AB (ref 36.0–46.0)
HEMOGLOBIN: 8.1 g/dL — AB (ref 12.0–15.0)
LYMPHS PCT: 36 %
Lymphs Abs: 3.3 10*3/uL (ref 0.7–4.0)
MCH: 35.5 pg — AB (ref 26.0–34.0)
MCHC: 38.8 g/dL — AB (ref 30.0–36.0)
MCV: 91.7 fL (ref 78.0–100.0)
MONOS PCT: 4 %
Monocytes Absolute: 0.4 10*3/uL (ref 0.1–1.0)
NEUTROS ABS: 4.3 10*3/uL (ref 1.7–7.7)
NEUTROS PCT: 47 %
Platelets: 338 10*3/uL (ref 150–400)
RBC: 2.28 MIL/uL — ABNORMAL LOW (ref 3.87–5.11)
RDW: 23 % — ABNORMAL HIGH (ref 11.5–15.5)
WBC: 9.2 10*3/uL (ref 4.0–10.5)

## 2017-01-05 LAB — HIV ANTIBODY (ROUTINE TESTING W REFLEX): HIV Screen 4th Generation wRfx: NONREACTIVE

## 2017-01-05 LAB — BPAM RBC
BLOOD PRODUCT EXPIRATION DATE: 201809202359
ISSUE DATE / TIME: 201809031110
UNIT TYPE AND RH: 5100

## 2017-01-05 LAB — LACTATE DEHYDROGENASE: LDH: 386 U/L — ABNORMAL HIGH (ref 98–192)

## 2017-01-05 MED ORDER — OXYCODONE HCL 5 MG PO TABS: 10.0000 mg | ORAL_TABLET | ORAL | Status: DC | PRN

## 2017-01-05 MED ORDER — DIPHENHYDRAMINE HCL 25 MG PO CAPS
25.0000 mg | ORAL_CAPSULE | Freq: Four times a day (QID) | ORAL | Status: DC | PRN
  Administered 2017-01-05: 25 mg via ORAL
  Filled 2017-01-05: qty 1

## 2017-01-05 NOTE — Progress Notes (Signed)
Date: January 05, 2017 Chart reviewed for discharge orders: None found for case management. Latoyia Tecson,BSN,RN3, CCM/[phone] 

## 2017-01-05 NOTE — Discharge Summary (Signed)
Lisa Tucker MRN: [id number] DOB/AGE: [birth year] 21 y.o.  Admit date: 01/03/2017 Discharge date: 01/05/2017  Primary Care Physician:  Alphonse Guild, MD   Discharge Diagnoses:   Patient Active Problem List   Diagnosis Date Noted  . Anemia of chronic disease 01/04/2017    DISCHARGE MEDICATION: Allergies as of 01/05/2017   No Known Allergies     Medication List    TAKE these medications   albuterol 108 (90 Base) MCG/ACT inhaler Commonly known as:  PROVENTIL HFA;VENTOLIN HFA Inhale 1 puff into the lungs every 6 (six) hours as needed for wheezing or shortness of breath.   beclomethasone 80 MCG/ACT inhaler Commonly known as:  QVAR Inhale 2 puffs into the lungs 2 (two) times daily.   etonogestrel 68 MG Impl implant Commonly known as:  NEXPLANON 1 each by Subdermal route once.   folic acid 1 MG tablet Commonly known as:  FOLVITE Take 1 mg by mouth daily.   hydroxyurea 500 MG capsule Commonly known as:  HYDREA Take 2,000-2,500 mg by mouth See admin instructions. Pt takes 4 caps (2000mg ) by mouth daily Monday-Friday, takes 5 caps (2500mg ) by mouth Saturday, Sunday.Marland KitchenMarland KitchenMay take with food to minimize GI side effects.   montelukast 10 MG tablet Commonly known as:  SINGULAIR Take 10 mg by mouth daily.   oxycodone 5 MG capsule Commonly known as:  OXY-IR Take 5-10 mg by mouth every 4 (four) hours as needed.   penicillin v potassium 250 MG tablet Commonly known as:  VEETID Take 250 mg by mouth 2 (two) times daily.   VITAMIN D (CHOLECALCIFEROL) PO Take 1 tablet by mouth daily. OTC, strength unknown         Consults:    SIGNIFICANT DIAGNOSTIC STUDIES:  Dg Chest 2 View  Result Date: 01/03/2017 CLINICAL DATA:  Cough, sickle cell anemia EXAM: CHEST  2 VIEW COMPARISON:  06/11/2016 chest radiograph. FINDINGS: New enlargement of the cardiopericardial silhouette. Otherwise normal mediastinal contour. No pneumothorax. No pleural effusion. No acute consolidative airspace disease.  No overt pulmonary edema. Visualized osseous structures appear intact. IMPRESSION: New enlargement of the cardiopericardial silhouette, cannot exclude a pericardial effusion. No active pulmonary disease. Electronically Signed   By: Delbert Phenix M.D.   On: 01/03/2017 17:37     ECHO:   Study Conclusions  - Left ventricle: The cavity size was mildly dilated. Wall   thickness was normal. Systolic function was normal. The estimated   ejection fraction was in the range of 55% to 60%. Wall motion was   normal; there were no regional wall motion abnormalities. Left   ventricular diastolic function parameters were normal. - Pericardium, extracardiac: A trivial pericardial effusion was   identified.   Recent Results (from the past 240 hour(s))  MRSA PCR Screening     Status: None   Collection Time: 01/03/17 11:13 PM  Result Value Ref Range Status   MRSA by PCR NEGATIVE NEGATIVE Final    Comment:        The GeneXpert MRSA Assay (FDA approved for NASAL specimens only), is one component of a comprehensive MRSA colonization surveillance program. It is not intended to diagnose MRSA infection nor to guide or monitor treatment for MRSA infections.     BRIEF ADMITTING H & P: Lisa Tucker (goes by [employer]") is a 21 y.o. female with medical history significant of sickle cell anemia (SS disease), remote pericardial effusion, and asthma presenting because she was feeling very lethargic, weak, sore, congested.  Some greenish mucus that has turned clear.  Has felt like she was fighting off some kind of infection and like she was going to have a crisis.  Symptoms started yesterday evening.  A typical pain crisis involves a very sharp pain in her lower back, unbearable; crises are also with fever, chills, weakness, fatigue, cough, sneezing, rhinorrhea.  She does not have the pain in her back now but she does have mild joint pain.  She does have a h/o acute chest and sepsis, takes PCN for prohylaxis.  She has  worn a Holter in 2015 for pericardial effusion, followed up every 6 months but last f/u was in 2016.  Last Echo was in 2015-2016.  No chest pain, no SOB.  Cough started in the ER and a little bit before that.  Last crisis was in Feb 2018, hospitalizaed in [place].  For her usual crisis, she is typically treated with Rocephin and given prn pain medication; she has never needed a PCA and does not take opiates on an ongoing basis.   ED Course: Sickle cell crisis but little pain.  Intermittent cough.  CXR with enlargement of pericardial silhouette and she does have a h/o small pericardial effusion.  Troponin 0.03.  Dr. Mayford Knife called and suggests trending troponin and checking Echo in AM.  Given Rocephin x 1 at patient request for cough.   Hospital Course:  Present on Admission: . (Resolved) Sickle cell crisis (HCC) . Anemia of chronic disease  Pt was admitted with simple vaso-occlusive crisis and pain was managed primarily with Toradol. Pt required minimal opiates for control of pain and had no opiate use in the last 24 hours. At the time of discharge her pain was 0/10 and patient was ambulatory and able to perform ADL's without difficulty.   She has a decrease in Hb from her baseline of 8 g/dL to a nadir of 6.5 g/dL. She also had a robust reticulocytosis that remained at >23%. However she was transfused 1 unit RBC's for reasons that are unclear and at the time of discharge, Hb was 8.1 g/dL.   Pt also had a CXR performed in the ED which showed an enlarged cardiac silhouette. She has a previous history of pericardial effusion and thus a 2-D ECHO was performed which showed trivial pericardial effusion. Please note that patient had no signs or symptoms of pericarditis or Tamponade.   Pt also had a HA which was treated with Excedrin Migraine to resolution after I was advised by her mother that she had normal TCD's in the past.   Pt received 1 dose of Rocephin while in the ED at her insistence. However  she had no signs of infection and appropriately antibiotics were not continued on admission to the hospital. She had no signs and symptoms of infection.   Mother present for entire hospital stay and with patients permission was present for all encounters. All questions answered.    Disposition and Follow-up:  Pt discharged in good condition. She has a follow up appointment with her Primary Hematologist in [place] in November. However she has been instructed schedule an earlier appointment if necessary.    DISCHARGE EXAM:  General: Alert, awake, oriented x3, in no apparent distress.  HEENT: [place]/AT PEERL, EOMI, anicteric Neck: Trachea midline, no masses, no thyromegal,y no JVD, no carotid bruit OROPHARYNX: Moist, No exudate/ erythema/lesions.  Heart: Regular rate and rhythm, without murmurs, rubs, gallops or S3. PMI non-displaced. Exam reveals no decreased pulses. Pulmonary/Chest: Normal effort. Breath sounds normal. No. Apnea. Clear to auscultation,no stridor,  no  wheezing and no rhonchi noted. No respiratory distress and no tenderness noted. Abdomen: Soft, nontender, nondistended, normal bowel sounds, no masses no hepatosplenomegaly noted. No fluid wave and no ascites. There is no guarding or rebound. Neuro: Alert and oriented to person, place and time. Normal motor skills, Displays no atrophy or tremors and exhibits normal muscle tone.  No focal neurological deficits noted cranial nerves II through XII grossly intact. No sensory deficit noted.  Strength at baseline in bilateral upper and lower extremities. Gait normal. Musculoskeletal: No warmth swelling or erythema around joints, no spinal tenderness noted. Psychiatric: Patient alert and oriented x3, good insight and cognition, good recent to remote recall. Lymph node survey: No cervical axillary or inguinal lymphadenopathy noted. Skin: Skin is warm and dry. No bruising, no ecchymosis and no rash noted. Pt is not diaphoretic. No erythema.  No pallor    Blood pressure (!) 97/51, pulse 72, temperature 97.6 F (36.4 C), temperature source Axillary, resp. rate 16, height 5\' 4"  (1.626 m), weight 75.8 kg (167 lb 1.7 oz), last menstrual period 12/20/2016, SpO2 99 %.   Recent Labs  01/03/17 1720  NA 140  K 3.9  CL 107  CO2 24  GLUCOSE 93  BUN 8  CREATININE 0.49  CALCIUM 9.2    Recent Labs  01/03/17 1720  AST 42*  ALT 18  ALKPHOS 63  BILITOT 3.2*  PROT 7.6  ALBUMIN 4.2   No results for input(s): LIPASE, AMYLASE in the last 72 hours.  Recent Labs  01/04/17 0636 01/05/17 0518  WBC 10.1 9.2  NEUTROABS 5.8 4.3  HGB 6.5* 8.1*  HCT 16.8* 20.9*  MCV 95.5 91.7  PLT 286 338     Total time spent including face to face and decision making was greater than 30 minutes  Signed: Jamonte Curfman A. 01/05/2017, 10:57 AM

## 2017-01-05 NOTE — Progress Notes (Signed)
Ambulated pt 150 feet in hallway. Pts o2 sat was 93% at rest with pulse of 72. After walking pts o2 sat was 91-93% with a pulse of 79.

## 2017-02-07 ENCOUNTER — Emergency Department (HOSPITAL_COMMUNITY)
Admission: EM | Admit: 2017-02-07 | Discharge: 2017-02-08 | Disposition: A | Discharge status: Home / Self Care | Payer: Medicaid Other | Attending: Emergency Medicine | Admitting: Emergency Medicine

## 2017-02-07 ENCOUNTER — Encounter (HOSPITAL_COMMUNITY): Payer: Self-pay | Admitting: Emergency Medicine

## 2017-02-07 ENCOUNTER — Emergency Department (HOSPITAL_COMMUNITY): Payer: Medicaid Other

## 2017-02-07 DIAGNOSIS — R51 Headache: Secondary | ICD-10-CM | POA: Insufficient documentation

## 2017-02-07 DIAGNOSIS — D571 Sickle-cell disease without crisis: Secondary | ICD-10-CM | POA: Diagnosis not present

## 2017-02-07 DIAGNOSIS — R519 Headache, unspecified: Secondary | ICD-10-CM

## 2017-02-07 DIAGNOSIS — J45909 Unspecified asthma, uncomplicated: Secondary | ICD-10-CM | POA: Diagnosis not present

## 2017-02-07 DIAGNOSIS — Z79899 Other long term (current) drug therapy: Secondary | ICD-10-CM | POA: Insufficient documentation

## 2017-02-07 LAB — CBC WITH DIFFERENTIAL/PLATELET
Basophils Absolute: 0.1 10*3/uL (ref 0.0–0.1)
Basophils Relative: 1 %
EOS ABS: 0.6 10*3/uL (ref 0.0–0.7)
EOS PCT: 6 %
HCT: 20.9 % — ABNORMAL LOW (ref 36.0–46.0)
HEMOGLOBIN: 7.7 g/dL — AB (ref 12.0–15.0)
LYMPHS ABS: 4.1 10*3/uL — AB (ref 0.7–4.0)
Lymphocytes Relative: 42 %
MCH: 34.5 pg — AB (ref 26.0–34.0)
MCHC: 36.8 g/dL — AB (ref 30.0–36.0)
MCV: 93.7 fL (ref 78.0–100.0)
MONOS PCT: 5 %
Monocytes Absolute: 0.5 10*3/uL (ref 0.1–1.0)
Neutro Abs: 4.5 10*3/uL (ref 1.7–7.7)
Neutrophils Relative %: 47 %
Platelets: 431 10*3/uL — ABNORMAL HIGH (ref 150–400)
RBC: 2.23 MIL/uL — ABNORMAL LOW (ref 3.87–5.11)
RDW: 23.8 % — ABNORMAL HIGH (ref 11.5–15.5)
WBC: 9.7 10*3/uL (ref 4.0–10.5)

## 2017-02-07 LAB — COMPREHENSIVE METABOLIC PANEL
ALK PHOS: 62 U/L (ref 38–126)
ALT: 14 U/L (ref 14–54)
ANION GAP: 7 (ref 5–15)
AST: 34 U/L (ref 15–41)
Albumin: 4.2 g/dL (ref 3.5–5.0)
BILIRUBIN TOTAL: 2.2 mg/dL — AB (ref 0.3–1.2)
BUN: <5 mg/dL — ABNORMAL LOW (ref 6–20)
CALCIUM: 9 mg/dL (ref 8.9–10.3)
CO2: 25 mmol/L (ref 22–32)
Chloride: 110 mmol/L (ref 101–111)
Creatinine, Ser: 0.85 mg/dL (ref 0.44–1.00)
GFR calc non Af Amer: >60 mL/min (ref >60)
Glucose, Bld: 92 mg/dL (ref 65–99)
Potassium: 3.8 mmol/L (ref 3.5–5.1)
SODIUM: 142 mmol/L (ref 135–145)
TOTAL PROTEIN: 7.2 g/dL (ref 6.5–8.1)

## 2017-02-07 LAB — RETICULOCYTES
RBC.: 2.23 MIL/uL — AB (ref 3.87–5.11)
Retic Ct Pct: >23 % — ABNORMAL HIGH (ref 0.4–3.1)

## 2017-02-07 LAB — D-DIMER, QUANTITATIVE: D-Dimer, Quant: 2.18 ug/mL-FEU — ABNORMAL HIGH (ref 0.00–0.50)

## 2017-02-07 MED ORDER — IOPAMIDOL (ISOVUE-370) INJECTION 76%
INTRAVENOUS | Status: AC
  Administered 2017-02-07: 100 mL via INTRAVENOUS
  Administered 2017-02-07: 23:00:00
  Filled 2017-02-07: qty 100

## 2017-02-07 NOTE — ED Triage Notes (Signed)
Pt has hx of sickle cell. Pt noticed that her lips were darker than normal and she had a HA. Took her SPO2 and found it was in 80s at home. Spo2 89% on RA in triage. Pt reports she has a hx of hypoxia before. Not in sickle cell crisis today.

## 2017-02-08 MED ORDER — KETOROLAC TROMETHAMINE 30 MG/ML IJ SOLN
30.0000 mg | Freq: Once | INTRAMUSCULAR | Status: AC
  Administered 2017-02-08: 30 mg via INTRAVENOUS
  Filled 2017-02-08: qty 1

## 2017-02-09 ENCOUNTER — Telehealth (HOSPITAL_COMMUNITY): Payer: Self-pay | Admitting: *Deleted

## 2017-02-09 NOTE — ED Provider Notes (Signed)
[facility]-EMERGENCY DEPT Provider Note   CSN: [id number] Arrival date & time: 02/07/17  1824     History   Chief Complaint Chief Complaint  Patient presents with  . hypoxia    HPI Lisa Tucker is a 21 y.o. female.  Patient states she gets a headache and lip color change when she gets hypoxic. This happened today again (happens randomly every so often without trigger or pattern) and came hrere for eval. No sob, cp, sickle cell pain or other complaints. Seems to be made better with deep breaths but no other modifying symptoms.       Past Medical History:  Diagnosis Date  . Asthma   . Pericardial effusion 2015  . Sickle cell anemia (HCC)    SS disease; has required [place] O2 for several months twice in the past (2013, 2017)    Patient Active Problem List   Diagnosis Date Noted  . Anemia of chronic disease 01/04/2017    Past Surgical History:  Procedure Laterality Date  . APPENDECTOMY  2011    OB History    No data available       Home Medications    Prior to Admission medications   Medication Sig Start Date End Date Taking? Authorizing Provider  albuterol (PROVENTIL HFA;VENTOLIN HFA) 108 (90 Base) MCG/ACT inhaler Inhale 2 puffs into the lungs every 6 (six) hours as needed for wheezing or shortness of breath.    Yes [provider]  beclomethasone (QVAR) 80 MCG/ACT inhaler Inhale 2 puffs into the lungs 2 (two) times daily.   Yes [provider]  etonogestrel (NEXPLANON) 68 MG IMPL implant 1 each by Subdermal route once.   Yes [provider]  folic acid (FOLVITE) 1 MG tablet Take 1 mg by mouth daily.   Yes [provider]  hydroxyurea (HYDREA) 500 MG capsule Take 2,000-2,500 mg by mouth See admin instructions. Pt takes 4 caps ( ) by mouth daily Monday-Friday, takes 5 caps ( ) by mouth Saturday, Sunday.Marland KitchenMarland KitchenMay take with food to minimize GI side effects.   Yes [provider]  montelukast (SINGULAIR) 10 MG tablet Take 10 mg  by mouth daily.   Yes [provider]  oxycodone (OXY-IR) 5 MG capsule Take 5-10 mg by mouth every 4 (four) hours as needed for pain.    Yes [provider]  penicillin v potassium (VEETID) 250 MG tablet Take 250 mg by mouth 2 (two) times daily.   Yes [provider]  VITAMIN D, CHOLECALCIFEROL, PO Take 1 tablet by mouth daily. OTC, strength unknown   Yes [provider]    Family History Family History  Problem Relation Age of Onset  . Hypertension Mother     Social History Social History  Substance Use Topics  . Smoking status: Never Smoker  . Smokeless tobacco: Never Used  . Alcohol use No     Allergies   Patient has no known allergies.   Review of Systems Review of Systems  All other systems reviewed and are negative.    Physical Exam Updated Vital Signs BP 106/76 (BP Location: Right Arm)   Pulse 76   Temp 99.2 F (37.3 C) (Oral)   Resp 17   LMP 02/07/2017   SpO2 96%   Physical Exam  Constitutional: She is oriented to person, place, and time. She appears well-developed and well-nourished.  HENT:  Head: Normocephalic and atraumatic.  Has glatter/sparkly lipstick on   Eyes: Conjunctivae and EOM are normal.  Neck: Normal range of motion.  Cardiovascular: Normal rate and regular rhythm.   Pulmonary/Chest: Effort normal. No stridor. No respiratory distress. She has no wheezes. She has no rales.  Initially hypoxic to 89 but consistently above 95 subsequently even when sleeping  Abdominal: She exhibits no distension.  Musculoskeletal: She exhibits no edema or deformity.  Neurological: She is alert and oriented to person, place, and time.  Psychiatric: She has a normal mood and affect.  Nursing note and vitals reviewed.    ED Treatments / Results  Labs (all labs ordered are listed, but only abnormal results are displayed) Labs Reviewed  CBC WITH DIFFERENTIAL/PLATELET - Abnormal; Notable for the following:       Result  Value   RBC 2.23 (*)    Hemoglobin 7.7 (*)    HCT 20.9 (*)    MCH 34.5 (*)    MCHC 36.8 (*)    RDW 23.8 (*)    Platelets 431 (*)    Lymphs Abs 4.1 (*)    All other components within normal limits  COMPREHENSIVE METABOLIC PANEL - Abnormal; Notable for the following:    BUN <5 (*)    Total Bilirubin 2.2 (*)    All other components within normal limits  RETICULOCYTES - Abnormal; Notable for the following:    Retic Ct Pct >23.0 (*)    RBC. 2.23 (*)    All other components within normal limits  D-DIMER, QUANTITATIVE (NOT AT [facility]) - Abnormal; Notable for the following:    D-Dimer, Quant 2.18 (*)    All other components within normal limits    EKG  EKG Interpretation  Date/Time:  Sunday February 07 2017 18:51:35 EDT Ventricular Rate:  82 PR Interval:    QRS Duration: 91 QT Interval:  390 QTC Calculation: 456 R Axis:   66 Text Interpretation:  Sinus rhythm Borderline T abnormalities, anterior leads No significant change since last tracing Confirmed by Marily Memos [phone]) on 02/07/2017 9:57:14 PM Also confirmed by Marily Memos [phone]), editor Madalyn Rob [phone])  on 02/08/2017 7:28:41 AM       Radiology Dg Chest 2 View  Result Date: 02/07/2017 CLINICAL DATA:  History of sickle cell.  Headache and hypoxia. EXAM: CHEST  2 VIEW COMPARISON:  01/03/2017 FINDINGS: Stable enlargement of the cardiac silhouette. No aortic aneurysm. Clear lungs without effusion or pneumothorax. No acute appearing osseous abnormality. IMPRESSION: Stable enlargement of the cardiopericardial silhouette. No active pulmonary disease. Electronically Signed   By: Tollie Eth M.D.   On: 02/07/2017 19:36   Ct Angio Chest Pe W And/or Wo Contrast  Result Date: 02/07/2017 CLINICAL DATA:  Sickle cell anemia.  Hypoxia.  Headache. EXAM: CT ANGIOGRAPHY CHEST WITH CONTRAST TECHNIQUE: Multidetector CT imaging of the chest was performed using the standard protocol during bolus administration of intravenous contrast.  Multiplanar CT image reconstructions and MIPs were obtained to evaluate the vascular anatomy. CONTRAST:  100 mL Isovue 370 COMPARISON:  Chest radiograph 02/07/2017 FINDINGS: Cardiovascular: Contrast injection is sufficient to demonstrate satisfactory opacification of the pulmonary arteries to the segmental level. There is no pulmonary embolus. The main pulmonary artery is within normal limits for size. There is no CT evidence of acute right heart strain. The visualized aorta is normal. There is a normal 3-vessel arch branching pattern. Heart size is enlarged without pericardial effusion. Mediastinum/Nodes: No mediastinal, hilar or axillary lymphadenopathy. The visualized thyroid and thoracic esophageal course are unremarkable. Lungs/Pleura: No pulmonary nodules or masses. No pleural effusion or pneumothorax. No focal airspace consolidation. No focal pleural abnormality. Upper  Abdomen: Contrast bolus timing is not optimized for evaluation of the abdominal organs. Shrunken, hyperdense spleen. Musculoskeletal: No chest wall abnormality. No acute or significant osseous findings. Review of the MIP images confirms the above findings. IMPRESSION: No pulmonary embolus or other acute thoracic abnormality. Electronically Signed   By: Deatra Robinson M.D.   On: 02/07/2017 22:59    Procedures Procedures (including critical care time)  Medications Ordered in ED Medications  iopamidol (ISOVUE-370) 76 % injection (  Contrast Given 02/07/17 2320)  ketorolac (TORADOL) 30 MG/ML injection 30 mg (30 mg Intravenous Given 02/08/17 0004)     Initial Impression / Assessment and Plan / ED Course  I have reviewed the triage vital signs and the nursing notes.  Pertinent labs & imaging results that were available during my care of the patient were reviewed by me and considered in my medical decision making (see chart for details).     Patient with reported hypoxia, but after triage none witnessed here. Ambulated with pulse ox  >95 and usually iin 97 range. No e/o sickle crisis or significant anemia or PE. Patient stated her oxygen dropped again while she was here and put oxygen back on herself however this was not witnessed. It was taken off again and she stayed at 95. Without any emergent causes identified, stable for discharge for hematology follow up. Doubt emergent cause for headache with it's intermittent, non-severe nature. Will advise continued ibuprofen treatment as per her normal.   Final Clinical Impressions(s) / ED Diagnoses   Final diagnoses:  Nonintractable headache, unspecified chronicity pattern, unspecified headache type    New Prescriptions Discharge Medication List as of 02/08/2017 12:44 AM       Aunesti Pellegrino, Barbara Cower, MD 02/09/17 1013

## 2017-02-09 NOTE — Telephone Encounter (Signed)
[facility] with c/o "feeling like oxygen levels are low". Patient verbalized she is not experiencing pain at this time, but that she was in the ED a couple days ago and received O2 and was sent home after oxygen saturation increased WNL. Patient denies SCC pain, SOB, trouble breathing, or chest pain.    Provider aware. Educated patient to contact her PCP for follow up and if in distress or experiencing CP or SOB to seek medical attention. Patient verbalized understanding.

## 2017-08-11 ENCOUNTER — Encounter (HOSPITAL_COMMUNITY): Payer: Self-pay

## 2017-08-11 DIAGNOSIS — R52 Pain, unspecified: Secondary | ICD-10-CM | POA: Insufficient documentation

## 2017-08-11 DIAGNOSIS — R531 Weakness: Secondary | ICD-10-CM | POA: Insufficient documentation

## 2017-08-11 DIAGNOSIS — R0602 Shortness of breath: Secondary | ICD-10-CM | POA: Insufficient documentation

## 2017-08-11 DIAGNOSIS — J45909 Unspecified asthma, uncomplicated: Secondary | ICD-10-CM | POA: Insufficient documentation

## 2017-08-11 DIAGNOSIS — D638 Anemia in other chronic diseases classified elsewhere: Secondary | ICD-10-CM | POA: Diagnosis not present

## 2017-08-11 DIAGNOSIS — Z79899 Other long term (current) drug therapy: Secondary | ICD-10-CM | POA: Diagnosis not present

## 2017-08-11 DIAGNOSIS — D5700 Hb-SS disease with crisis, unspecified: Secondary | ICD-10-CM | POA: Diagnosis present

## 2017-08-11 DIAGNOSIS — R05 Cough: Secondary | ICD-10-CM | POA: Diagnosis not present

## 2017-08-11 DIAGNOSIS — R509 Fever, unspecified: Secondary | ICD-10-CM | POA: Insufficient documentation

## 2017-08-11 NOTE — ED Triage Notes (Signed)
Pt presents with c/o generalized body aches and low grade fever. Pt reports congestion, sore throat, and general malaise. Pt also reports she has sickle cell disease but does not feel as though she is in crisis at this time.

## 2017-08-12 ENCOUNTER — Other Ambulatory Visit: Payer: Self-pay

## 2017-08-12 ENCOUNTER — Emergency Department (HOSPITAL_COMMUNITY): Payer: Medicaid Other

## 2017-08-12 ENCOUNTER — Observation Stay (HOSPITAL_COMMUNITY)
Admission: EM | Admit: 2017-08-12 | Discharge: 2017-08-13 | Disposition: A | Discharge status: Home / Self Care | Payer: Medicaid Other | Attending: Internal Medicine | Admitting: Internal Medicine

## 2017-08-12 DIAGNOSIS — J45909 Unspecified asthma, uncomplicated: Secondary | ICD-10-CM | POA: Diagnosis present

## 2017-08-12 DIAGNOSIS — D5700 Hb-SS disease with crisis, unspecified: Secondary | ICD-10-CM | POA: Diagnosis present

## 2017-08-12 DIAGNOSIS — R509 Fever, unspecified: Secondary | ICD-10-CM | POA: Diagnosis present

## 2017-08-12 DIAGNOSIS — D72829 Elevated white blood cell count, unspecified: Secondary | ICD-10-CM | POA: Diagnosis present

## 2017-08-12 DIAGNOSIS — R6510 Systemic inflammatory response syndrome (SIRS) of non-infectious origin without acute organ dysfunction: Secondary | ICD-10-CM | POA: Diagnosis present

## 2017-08-12 DIAGNOSIS — D638 Anemia in other chronic diseases classified elsewhere: Secondary | ICD-10-CM | POA: Diagnosis present

## 2017-08-12 LAB — URINALYSIS, ROUTINE W REFLEX MICROSCOPIC
Bilirubin Urine: NEGATIVE
Glucose, UA: NEGATIVE mg/dL
Ketones, ur: NEGATIVE mg/dL
Leukocytes, UA: NEGATIVE
Nitrite: NEGATIVE
Protein, ur: NEGATIVE mg/dL
Specific Gravity, Urine: 1.011 (ref 1.005–1.030)
pH: 6 (ref 5.0–8.0)

## 2017-08-12 LAB — COMPREHENSIVE METABOLIC PANEL
ALK PHOS: 58 U/L (ref 38–126)
ALT: 17 U/L (ref 14–54)
AST: 37 U/L (ref 15–41)
Albumin: 4.6 g/dL (ref 3.5–5.0)
Anion gap: 9 (ref 5–15)
BILIRUBIN TOTAL: 4.3 mg/dL — AB (ref 0.3–1.2)
BUN: 5 mg/dL — AB (ref 6–20)
CALCIUM: 9 mg/dL (ref 8.9–10.3)
CHLORIDE: 110 mmol/L (ref 101–111)
CO2: 20 mmol/L — ABNORMAL LOW (ref 22–32)
CREATININE: 0.52 mg/dL (ref 0.44–1.00)
GFR calc Af Amer: >60 mL/min (ref >60)
Glucose, Bld: 84 mg/dL (ref 65–99)
Potassium: 3.9 mmol/L (ref 3.5–5.1)
Sodium: 139 mmol/L (ref 135–145)
Total Protein: 7.7 g/dL (ref 6.5–8.1)

## 2017-08-12 LAB — INFLUENZA PANEL BY PCR (TYPE A & B)
Influenza A By PCR: NEGATIVE
Influenza B By PCR: NEGATIVE

## 2017-08-12 LAB — CBC WITH DIFFERENTIAL/PLATELET
Basophils Absolute: 0 K/uL (ref 0.0–0.1)
Basophils Relative: 0 %
Eosinophils Absolute: 0.4 K/uL (ref 0.0–0.7)
Eosinophils Relative: 2 %
HCT: 19.7 % — ABNORMAL LOW (ref 36.0–46.0)
Hemoglobin: 7.2 g/dL — ABNORMAL LOW (ref 12.0–15.0)
Lymphocytes Relative: 11 %
Lymphs Abs: 2.5 K/uL (ref 0.7–4.0)
MCH: 33.6 pg (ref 26.0–34.0)
MCHC: 36.5 g/dL — ABNORMAL HIGH (ref 30.0–36.0)
MCV: 92.1 fL (ref 78.0–100.0)
Monocytes Absolute: 1.1 K/uL — ABNORMAL HIGH (ref 0.1–1.0)
Monocytes Relative: 5 %
Neutro Abs: 18.4 K/uL — ABNORMAL HIGH (ref 1.7–7.7)
Neutrophils Relative %: 82 %
Platelets: 512 K/uL — ABNORMAL HIGH (ref 150–400)
RBC: 2.14 MIL/uL — ABNORMAL LOW (ref 3.87–5.11)
RDW: 24.9 % — ABNORMAL HIGH (ref 11.5–15.5)
WBC: 22.4 K/uL — ABNORMAL HIGH (ref 4.0–10.5)

## 2017-08-12 LAB — I-STAT CG4 LACTIC ACID, ED: Lactic Acid, Venous: 1.17 mmol/L (ref 0.5–1.9)

## 2017-08-12 LAB — RETICULOCYTES
RBC.: 2.1 MIL/uL — ABNORMAL LOW (ref 3.87–5.11)
RETIC COUNT ABSOLUTE: 373.8 10*3/uL — AB (ref 19.0–186.0)
Retic Ct Pct: 17.8 % — ABNORMAL HIGH (ref 0.4–3.1)

## 2017-08-12 MED ORDER — VITAMIN D (CHOLECALCIFEROL) 25 MCG (1000 UT) PO TABS: ORAL_TABLET | Freq: Every day | ORAL | Status: DC

## 2017-08-12 MED ORDER — HYDROMORPHONE HCL 1 MG/ML IJ SOLN
0.5000 mg | INTRAMUSCULAR | Status: AC
  Administered 2017-08-12: 0.5 mg via INTRAVENOUS
  Filled 2017-08-12: qty 1

## 2017-08-12 MED ORDER — HYDROXYUREA 500 MG PO CAPS: 2000.0000 mg | ORAL_CAPSULE | ORAL | Status: DC

## 2017-08-12 MED ORDER — ONDANSETRON HCL 4 MG/2ML IJ SOLN: 4.0000 mg | Freq: Four times a day (QID) | INTRAMUSCULAR | Status: DC | PRN

## 2017-08-12 MED ORDER — HYDROMORPHONE HCL 1 MG/ML IJ SOLN
1.0000 mg | INTRAMUSCULAR | Status: AC
  Administered 2017-08-12: 1 mg via INTRAVENOUS
  Filled 2017-08-12: qty 1

## 2017-08-12 MED ORDER — HYDROXYUREA 500 MG PO CAPS
2500.0000 mg | ORAL_CAPSULE | ORAL | Status: DC
  Administered 2017-08-12: 2500 mg via ORAL
  Filled 2017-08-12: qty 5

## 2017-08-12 MED ORDER — DEXTROSE-NACL 5-0.45 % IV SOLN
INTRAVENOUS | Status: DC
  Administered 2017-08-12 (×2): via INTRAVENOUS

## 2017-08-12 MED ORDER — KETOROLAC TROMETHAMINE 30 MG/ML IJ SOLN
30.0000 mg | Freq: Four times a day (QID) | INTRAMUSCULAR | Status: DC
  Filled 2017-08-12 (×2): qty 1

## 2017-08-12 MED ORDER — HYDROMORPHONE HCL 1 MG/ML IJ SOLN: 0.5000 mg | INTRAMUSCULAR | Status: AC

## 2017-08-12 MED ORDER — DIPHENHYDRAMINE HCL 50 MG/ML IJ SOLN: 12.5000 mg | Freq: Four times a day (QID) | INTRAMUSCULAR | Status: DC | PRN

## 2017-08-12 MED ORDER — VITAMIN D3 25 MCG (1000 UNIT) PO TABS
1000.0000 [IU] | ORAL_TABLET | Freq: Every day | ORAL | Status: DC
  Administered 2017-08-12 – 2017-08-13 (×2): 1000 [IU] via ORAL
  Filled 2017-08-12 (×2): qty 1

## 2017-08-12 MED ORDER — BUDESONIDE 0.25 MG/2ML IN SUSP
0.2500 mg | Freq: Two times a day (BID) | RESPIRATORY_TRACT | Status: DC
  Administered 2017-08-12 – 2017-08-13 (×2): 0.25 mg via RESPIRATORY_TRACT
  Filled 2017-08-12 (×2): qty 2

## 2017-08-12 MED ORDER — HYDROXYUREA 500 MG PO CAPS
2000.0000 mg | ORAL_CAPSULE | ORAL | Status: DC
  Administered 2017-08-13: 2000 mg via ORAL
  Filled 2017-08-12: qty 4

## 2017-08-12 MED ORDER — HYDROMORPHONE HCL 1 MG/ML IJ SOLN
1.0000 mg | INTRAMUSCULAR | Status: AC
  Filled 2017-08-12: qty 1

## 2017-08-12 MED ORDER — ALBUTEROL SULFATE (2.5 MG/3ML) 0.083% IN NEBU: 2.5000 mg | INHALATION_SOLUTION | Freq: Four times a day (QID) | RESPIRATORY_TRACT | Status: DC | PRN

## 2017-08-12 MED ORDER — SENNOSIDES-DOCUSATE SODIUM 8.6-50 MG PO TABS
1.0000 | ORAL_TABLET | Freq: Two times a day (BID) | ORAL | Status: DC
  Administered 2017-08-12 – 2017-08-13 (×3): 1 via ORAL
  Filled 2017-08-12 (×3): qty 1

## 2017-08-12 MED ORDER — DIPHENHYDRAMINE HCL 12.5 MG/5ML PO ELIX: 12.5000 mg | ORAL_SOLUTION | Freq: Four times a day (QID) | ORAL | Status: DC | PRN

## 2017-08-12 MED ORDER — SODIUM CHLORIDE 0.9 % IV SOLN
2.0000 g | Freq: Once | INTRAVENOUS | Status: AC
  Administered 2017-08-12: 2 g via INTRAVENOUS
  Filled 2017-08-12: qty 20

## 2017-08-12 MED ORDER — HYDROMORPHONE 1 MG/ML IV SOLN
INTRAVENOUS | Status: DC
  Administered 2017-08-12: 12:00:00 via INTRAVENOUS
  Administered 2017-08-12: 0.399 mg via INTRAVENOUS
  Administered 2017-08-13: 0.5 mg via INTRAVENOUS
  Administered 2017-08-13: 0 mg via INTRAVENOUS
  Administered 2017-08-13: 1 mg via INTRAVENOUS
  Administered 2017-08-13: 0 mg via INTRAVENOUS
  Filled 2017-08-12: qty 25

## 2017-08-12 MED ORDER — POLYETHYLENE GLYCOL 3350 17 G PO PACK: 17.0000 g | PACK | Freq: Every day | ORAL | Status: DC | PRN

## 2017-08-12 MED ORDER — HYDROMORPHONE HCL 1 MG/ML IJ SOLN: 1.0000 mg | INTRAMUSCULAR | Status: AC

## 2017-08-12 MED ORDER — BECLOMETHASONE DIPROPIONATE 80 MCG/ACT IN AERS: 2.0000 | INHALATION_SPRAY | Freq: Two times a day (BID) | RESPIRATORY_TRACT | Status: DC

## 2017-08-12 MED ORDER — MONTELUKAST SODIUM 10 MG PO TABS
10.0000 mg | ORAL_TABLET | Freq: Every day | ORAL | Status: DC
  Administered 2017-08-12 – 2017-08-13 (×2): 10 mg via ORAL
  Filled 2017-08-12 (×2): qty 1

## 2017-08-12 MED ORDER — ENOXAPARIN SODIUM 40 MG/0.4ML ~~LOC~~ SOLN
40.0000 mg | SUBCUTANEOUS | Status: DC
  Filled 2017-08-12: qty 0.4

## 2017-08-12 MED ORDER — NALOXONE HCL 0.4 MG/ML IJ SOLN: 0.4000 mg | INTRAMUSCULAR | Status: DC | PRN

## 2017-08-12 MED ORDER — SODIUM CHLORIDE 0.9 % IV BOLUS
1000.0000 mL | Freq: Once | INTRAVENOUS | Status: AC
  Administered 2017-08-12: 1000 mL via INTRAVENOUS

## 2017-08-12 MED ORDER — HYDROMORPHONE HCL 1 MG/ML IJ SOLN
1.0000 mg | INTRAMUSCULAR | Status: AC
  Administered 2017-08-12: 1 mg via INTRAVENOUS

## 2017-08-12 MED ORDER — FOLIC ACID 1 MG PO TABS
1.0000 mg | ORAL_TABLET | Freq: Every day | ORAL | Status: DC
  Administered 2017-08-13: 1 mg via ORAL
  Filled 2017-08-12 (×2): qty 1

## 2017-08-12 MED ORDER — SODIUM CHLORIDE 0.9% FLUSH: 9.0000 mL | INTRAVENOUS | Status: DC | PRN

## 2017-08-12 MED ORDER — DIPHENHYDRAMINE HCL 50 MG/ML IJ SOLN
25.0000 mg | Freq: Once | INTRAMUSCULAR | Status: AC
  Administered 2017-08-12: 25 mg via INTRAVENOUS
  Filled 2017-08-12: qty 1

## 2017-08-12 MED ORDER — ETONOGESTREL 68 MG ~~LOC~~ IMPL: 1.0000 | DRUG_IMPLANT | Freq: Once | SUBCUTANEOUS | Status: DC

## 2017-08-12 MED ORDER — ALBUTEROL SULFATE HFA 108 (90 BASE) MCG/ACT IN AERS: 2.0000 | INHALATION_SPRAY | Freq: Four times a day (QID) | RESPIRATORY_TRACT | Status: DC | PRN

## 2017-08-12 NOTE — ED Notes (Signed)
Pt placed on 2L nasal cannula.

## 2017-08-12 NOTE — ED Provider Notes (Addendum)
[facility]-EMERGENCY DEPT Provider Note   CSN: [id number] Arrival date & time: 08/11/17  2325     History   Chief Complaint Chief Complaint  Patient presents with  . Generalized Body Aches  . Fever    HPI Lisa Tucker is a 22 y.o. female.  HPI 22 year old very pleasant female with history of sickle cell disease here with fever and chills.  Patient states that her symptoms started yesterday.  She developed acute onset of fever, chills, and body aches.  She had associated cough and shortness of breath.  She has not had any sputum production.  She took her temperature and it was 101.  Throughout the day, she is had persistent fatigue, nausea, and fever.  She is also developed an aching, throbbing, lower back pain.  The pain is worse with palpation and movement.  It is similar to her usual sickle cell pain.  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  Denies any rash.  No abdominal pain.  No chest pain at this time.  Past Medical History:  Diagnosis Date  . Asthma   . Pericardial effusion 2015  . Sickle cell anemia (HCC)    SS disease; has required [place] O2 for several months twice in the past (2013, 2017)    Patient Active Problem List   Diagnosis Date Noted  . Anemia of chronic disease 01/04/2017    Past Surgical History:  Procedure Laterality Date  . APPENDECTOMY  2011     OB History   None      Home Medications    Prior to Admission medications   Medication Sig Start Date End Date Taking? Authorizing Provider  albuterol (PROVENTIL HFA;VENTOLIN HFA) 108 (90 Base) MCG/ACT inhaler Inhale 2 puffs into the lungs every 6 (six) hours as needed for wheezing or shortness of breath.    Yes [provider]  beclomethasone (QVAR) 80 MCG/ACT inhaler Inhale 2 puffs into the lungs 2 (two) times daily.   Yes [provider]  etonogestrel (NEXPLANON) 68 MG IMPL implant 1 each  by Subdermal route once.   Yes [provider]  folic acid (FOLVITE) 1 MG tablet Take 1 mg by mouth daily.   Yes [provider]  hydroxyurea (HYDREA) 500 MG capsule Take 2,000-2,500 mg by mouth See admin instructions. Pt takes 4 caps (2000mg ) on Monday, Wednesday and Friday , takes 5 caps (2500mg ) by mouth other days..May take with food to minimize GI side effects. Alternates   Yes [provider]  montelukast (SINGULAIR) 10 MG tablet Take 10 mg by mouth daily.   Yes [provider]  oxycodone (OXY-IR) 5 MG capsule Take 5-10 mg by mouth every 4 (four) hours as needed for pain.    Yes [provider]  penicillin v potassium (VEETID) 250 MG tablet Take 250 mg by mouth 2 (two) times daily.   Yes [provider]  VITAMIN D, CHOLECALCIFEROL, PO Take 1 tablet by mouth daily. OTC, strength unknown   Yes [provider]    Family History Family History  Problem Relation Age of Onset  . Hypertension Mother     Social History Social History   Tobacco Use  . Smoking status: Never Smoker  . Smokeless tobacco: Never Used  Substance Use Topics  . Alcohol use: No  . Drug use: No     Allergies   Patient has no known allergies.  Review of Systems Review of Systems  Constitutional: Positive for chills, fatigue and fever.  Respiratory: Positive for cough and shortness of breath.   Neurological: Positive for weakness.  All other systems reviewed and are negative.    Physical Exam Updated Vital Signs BP (!) 102/57   Pulse 86   Temp 100.2 F (37.9 C) (Oral)   Resp 16   Ht 5\' 5"  (1.651 m)   Wt 78.9 kg (174 lb)   LMP 08/04/2017 (Approximate)   SpO2 91%   BMI 28.96 kg/m   Physical Exam  Constitutional: She is oriented to person, place, and time. She appears well-developed and well-nourished. No distress.  HENT:  Head: Normocephalic and atraumatic.  Mild posterior pharyngeal erythema  Eyes: Conjunctivae are normal.    Neck: Neck supple.  Cardiovascular: Normal rate, regular rhythm and normal heart sounds. Exam reveals no friction rub.  No murmur heard. Pulmonary/Chest: Effort normal and breath sounds normal. No respiratory distress. She has no wheezes. She has no rales.  Abdominal: She exhibits no distension.  Musculoskeletal: She exhibits no edema.  Neurological: She is alert and oriented to person, place, and time. She exhibits normal muscle tone.  Skin: Skin is warm. Capillary refill takes less than 2 seconds.  Psychiatric: She has a normal mood and affect.  Nursing note and vitals reviewed.    ED Treatments / Results  Labs (all labs ordered are listed, but only abnormal results are displayed) Labs Reviewed  CBC WITH DIFFERENTIAL/PLATELET - Abnormal; Notable for the following components:      Result Value   WBC 22.4 (*)    RBC 2.14 (*)    Hemoglobin 7.2 (*)    HCT 19.7 (*)    MCHC 36.5 (*)    RDW 24.9 (*)    Platelets 512 (*)    Neutro Abs 18.4 (*)    Monocytes Absolute 1.1 (*)    All other components within normal limits  COMPREHENSIVE METABOLIC PANEL - Abnormal; Notable for the following components:   CO2 20 (*)    BUN 5 (*)    Total Bilirubin 4.3 (*)    All other components within normal limits  CULTURE, BLOOD (ROUTINE X 2)  CULTURE, BLOOD (ROUTINE X 2)  URINALYSIS, ROUTINE W REFLEX MICROSCOPIC  INFLUENZA PANEL BY PCR (TYPE A & B)  I-STAT CG4 LACTIC ACID, ED  I-STAT CG4 LACTIC ACID, ED    EKG None  Radiology Dg Chest 2 View  Result Date: 08/12/2017 CLINICAL DATA:  Cough, chills, fever. EXAM: CHEST - 2 VIEW COMPARISON:  Radiographs and CT 02/07/2017 FINDINGS: Mild cardiomegaly is unchanged from prior exam. Mediastinal contours are unchanged. The lungs are clear. Pulmonary vasculature is normal. No consolidation, pleural effusion, or pneumothorax. No acute osseous abnormalities are seen. IMPRESSION: Stable mild cardiomegaly without acute abnormality. Electronically Signed    By: Rubye Oaks M.D.   On: 08/12/2017 02:30    Procedures Procedures (including critical care time)  Medications Ordered in ED Medications  dextrose 5 %-0.45 % sodium chloride infusion ( Intravenous New Bag/Given 08/12/17 0218)  HYDROmorphone (DILAUDID) injection 1 mg (has no administration in time range)    Or  HYDROmorphone (DILAUDID) injection 1 mg (has no administration in time range)  sodium chloride 0.9 % bolus 1,000 mL (1,000 mLs Intravenous New Bag/Given 08/12/17 0411)  HYDROmorphone (DILAUDID) injection 0.5 mg (0.5 mg Intravenous Given 08/12/17 0218)    Or  HYDROmorphone (DILAUDID) injection 0.5 mg ( Subcutaneous See Alternative 08/12/17 0218)  HYDROmorphone (DILAUDID) injection  1 mg (1 mg Intravenous Given 08/12/17 0246)    Or  HYDROmorphone (DILAUDID) injection 1 mg ( Subcutaneous See Alternative 08/12/17 0246)  HYDROmorphone (DILAUDID) injection 1 mg (1 mg Intravenous Given 08/12/17 0411)    Or  HYDROmorphone (DILAUDID) injection 1 mg ( Subcutaneous See Alternative 08/12/17 0411)  diphenhydrAMINE (BENADRYL) injection 25 mg (25 mg Intravenous Given 08/12/17 0218)  cefTRIAXone (ROCEPHIN) 2 g in sodium chloride 0.9 % 100 mL IVPB (0 g Intravenous Stopped 08/12/17 0250)     Initial Impression / Assessment and Plan / ED Course  I have reviewed the triage vital signs and the nursing notes.  Pertinent labs & imaging results that were available during my care of the patient were reviewed by me and considered in my medical decision making (see chart for details).    22 year old female here with fever and back pain.  Regarding her fever, suspect viral illness.  Possibly influenza.  However, given her sickle cell disease, will cover empirically with Rocephin and sent labs.  She is well-appearing without signs of toxicity.  Regarding her back pain, this is similar to her usual pain.  Will give analgesia.  No urinary symptoms or flank pain to suggest referred pain from [place].  Lab work is  very reassuring.  She does have a significant leukocytosis, but normal lactic acid.  CMP at baseline.  Hemoglobin is near baseline.  She feels significantly improved after fluids and analgesia in the ED.  Urinalysis without signs of UTI.  Chest x-ray is clear.  She has no chest pain or shortness of breath or signs of acute chest syndrome.  Discussed the case with the hospitalist, Dr. Antionette Char, who does not feel patient needs admission at this time, and recommends outpatient management.  Given absence of any high-grade fever, white count less than 30,000, no history of bacteremia, no indwelling lines, no signs of systemic toxicity, and otherwise reassuring monitoring in the ED, feel this is reasonable.  She is been given a dose of Rocephin here.  Will have her follow-up with her sickle cell physician in 24 hours for possible repeat dose and reassessment.  I have also sent an influenza to follow-up as an outpatient.  Patient in agreement with this plan.  I will prescribe her an empiric Omnicef in the setting of her fever, but advised her to follow-up with her sickle cell doctor to discuss whether she needs to initiate and continue this. Will also cover empirically for flu at this time.  Given that pt does not have follow-up in [place], I discussed the case with Dr. Arlyce Dice, at [facility] in [place], where pt is followed. We discussed her labs, presentation. Patient reportedly has a h/o sepsis in past, so his recommendation would be to admit given h/o septicemia, also lack of follow-up here. Will admit. OF NOTE, blood cultures ordered and reportedly sent during down time.  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

## 2017-08-12 NOTE — ED Notes (Signed)
ED TO INPATIENT HANDOFF REPORT  Name/Age/Gender Lisa Tucker 22 y.o. female  Code Status Code Status History    Date Active Date Inactive Code Status Order ID Comments User Context   01/03/2017 2256 01/05/2017 1452 Full Code [id number]  Karmen Bongo, MD Inpatient      Home/SNF/Other Home  Chief Complaint Fever; Sickle Cell Pain Crisis  Level of Care/Admitting Diagnosis ED Disposition    ED Disposition Condition [place]: [facility] [409811]  Level of Care: Med-Surg [16]  Diagnosis: SIRS (systemic inflammatory response syndrome) ([place]) [914782]  Admitting Physician: Elwyn Reach [[id number]]  Attending Physician: Elwyn Reach [[id number]]  Estimated length of stay: past midnight tomorrow  Certification:: I certify this patient will need inpatient services for at least 2 midnights  PT Class (Do Not Modify): Inpatient [101]  PT Acc Code (Do Not Modify): Private [1]       Medical History Past Medical History:  Diagnosis Date  . Asthma   . Pericardial effusion 2015  . Sickle cell anemia (HCC)    SS disease; has required [place] O2 for several months twice in the past (2013, 2017)    Allergies No Known Allergies  IV Location/Drains/Wounds Patient Lines/Drains/Airways Status   Active Line/Drains/Airways    Name:   Placement date:   Placement time:   Site:   Days:   Peripheral IV 08/12/17 Right;Upper Arm   08/12/17    0400    Arm   less than 1          Labs/Imaging Results for orders placed or performed during the hospital encounter of 08/12/17 (from the past 48 hour(s))  CBC WITH DIFFERENTIAL     Status: Abnormal   Collection Time: 08/12/17  1:55 AM  Result Value Ref Range   WBC 22.4 (H) 4.0 - 10.5 K/uL   RBC 2.14 (L) 3.87 - 5.11 MIL/uL   Hemoglobin 7.2 (L) 12.0 - 15.0 g/dL   HCT 19.7 (L) 36.0 - 46.0 %   MCV 92.1 78.0 - 100.0 fL   MCH 33.6 26.0 - 34.0 pg   MCHC 36.5 (H) 30.0 - 36.0 g/dL   RDW 24.9 (H) 11.5 - 15.5 %   Platelets 512 (H) 150 - 400 K/uL   Neutrophils Relative % 82 %   Lymphocytes Relative 11 %   Monocytes Relative 5 %   Eosinophils Relative 2 %   Basophils Relative 0 %   Neutro Abs 18.4 (H) 1.7 - 7.7 K/uL   Lymphs Abs 2.5 0.7 - 4.0 K/uL   Monocytes Absolute 1.1 (H) 0.1 - 1.0 K/uL   Eosinophils Absolute 0.4 0.0 - 0.7 K/uL   Basophils Absolute 0.0 0.0 - 0.1 K/uL   RBC Morphology POLYCHROMASIA PRESENT     Comment: Sickle cells present Performed at [facility], [place] [address], [place], [place] [address]   Comprehensive metabolic panel     Status: Abnormal   Collection Time: 08/12/17  1:55 AM  Result Value Ref Range   Sodium 139 135 - 145 mmol/L   Potassium 3.9 3.5 - 5.1 mmol/L   Chloride 110 101 - 111 mmol/L   CO2 20 (L) 22 - 32 mmol/L   Glucose, Bld 84 65 - 99 mg/dL   BUN 5 (L) 6 - 20 mg/dL   Creatinine, Ser 0.52 0.44 - 1.00 mg/dL   Calcium 9.0 8.9 - 10.3 mg/dL   Total Protein 7.7 6.5 - 8.1 g/dL   Albumin 4.6 3.5 -  5.0 g/dL   AST 37 15 - 41 U/L   ALT 17 14 - 54 U/L   Alkaline Phosphatase 58 38 - 126 U/L   Total Bilirubin 4.3 (H) 0.3 - 1.2 mg/dL   GFR calc non Af Amer >60 >60 mL/min   GFR calc Af Amer >60 >60 mL/min    Comment: (NOTE) The eGFR has been calculated using the CKD EPI equation. This calculation has not been validated in all clinical situations. eGFR's persistently <60 mL/min signify possible Chronic Kidney Disease.    Anion gap 9 5 - 15    Comment: Performed at [facility], [place] [address], [place], [place] [address]  Urinalysis, Routine w reflex microscopic     Status: Abnormal   Collection Time: 08/12/17  4:42 AM  Result Value Ref Range   Color, Urine YELLOW YELLOW   APPearance CLEAR CLEAR   Specific Gravity, Urine 1.011 1.005 - 1.030   pH 6.0 5.0 - 8.0   Glucose, UA NEGATIVE NEGATIVE mg/dL   Hgb urine dipstick MODERATE (A) NEGATIVE   Bilirubin Urine NEGATIVE NEGATIVE   Ketones, ur NEGATIVE NEGATIVE mg/dL    Protein, ur NEGATIVE NEGATIVE mg/dL   Nitrite NEGATIVE NEGATIVE   Leukocytes, UA NEGATIVE NEGATIVE   RBC / HPF 0-5 0 - 5 RBC/hpf   WBC, UA 0-5 0 - 5 WBC/hpf   Bacteria, UA RARE (A) NONE SEEN   Squamous Epithelial / LPF 0-5 (A) NONE SEEN   Mucus PRESENT     Comment: Performed at [facility], [place] [address] [place], [place] [address]  Influenza panel by PCR (type A & B)     Status: None   Collection Time: 08/12/17  4:42 AM  Result Value Ref Range   Influenza A By PCR NEGATIVE NEGATIVE   Influenza B By PCR NEGATIVE NEGATIVE    Comment: (NOTE) The Xpert Xpress Flu assay is intended as an aid in the diagnosis of  influenza and should not be used as a sole basis for treatment.  This  assay is FDA approved for nasopharyngeal swab specimens only. Nasal  washings and aspirates are unacceptable for Xpert Xpress Flu testing. Performed at [facility], [place] [address] [place], [place] [address]   I-Stat CG4 Lactic Acid, ED     Status: None   Collection Time: 08/12/17  4:55 AM  Result Value Ref Range   Lactic Acid, Venous 1.17 0.5 - 1.9 mmol/L   Dg Chest 2 View  Result Date: 08/12/2017 CLINICAL DATA:  Cough, chills, fever. EXAM: CHEST - 2 VIEW COMPARISON:  Radiographs and CT 02/07/2017 FINDINGS: Mild cardiomegaly is unchanged from prior exam. Mediastinal contours are unchanged. The lungs are clear. Pulmonary vasculature is normal. No consolidation, pleural effusion, or pneumothorax. No acute osseous abnormalities are seen. IMPRESSION: Stable mild cardiomegaly without acute abnormality. Electronically Signed   By: Jeb Levering M.D.   On: 08/12/2017 02:30    Pending Labs Unresulted Labs (From admission, onward)   Start     Ordered   08/12/17 0057  Blood culture (routine x 2)  BLOOD CULTURE X 2,   STAT     08/12/17 0057   Signed and Held  CBC  (enoxaparin (LOVENOX)    CrCl >/= 30 ml/min)  Once,   R    Comments:  Baseline for enoxaparin therapy IF NOT  ALREADY DRAWN.  Notify MD if PLT < 100 K.    Signed and Held   Signed and Held  Creatinine, serum  (enoxaparin (LOVENOX)    CrCl >/= 30 ml/min)  Once,   R    Comments:  Baseline for enoxaparin therapy IF NOT ALREADY DRAWN.    Signed and Held   Signed and Held  Creatinine, serum  (enoxaparin (LOVENOX)    CrCl >/= 30 ml/min)  Weekly,   R    Comments:  while on enoxaparin therapy    Signed and Held   Signed and Held  Comprehensive metabolic panel  Tomorrow morning,   R     Signed and Held   Signed and Held  CBC with Differential/Platelet  Tomorrow morning,   R     Signed and Held   Signed and Held  Culture, blood (routine x 2)  BLOOD CULTURE X 2,   R     Signed and Held      Vitals/Pain Today's Vitals   08/12/17 0732 08/12/17 0800 08/12/17 0812 08/12/17 0900  BP:  101/62  106/62  Pulse:  78  72  Resp:  12  13  Temp:      TempSrc:      SpO2:  (S) 100%  (S) 100%  Weight:      Height:      PainSc: (S) Asleep  (S) Asleep     Isolation Precautions Droplet precaution  Medications Medications  dextrose 5 %-0.45 % sodium chloride infusion ( Intravenous New Bag/Given 08/12/17 0218)  HYDROmorphone (DILAUDID) injection 1 mg ( Intravenous Canceled Entry 08/12/17 0932)    Or  HYDROmorphone (DILAUDID) injection 1 mg ( Subcutaneous See Alternative 08/12/17 0932)  HYDROmorphone (DILAUDID) injection 0.5 mg (0.5 mg Intravenous Given 08/12/17 0218)    Or  HYDROmorphone (DILAUDID) injection 0.5 mg ( Subcutaneous See Alternative 08/12/17 0218)  HYDROmorphone (DILAUDID) injection 1 mg (1 mg Intravenous Given 08/12/17 0246)    Or  HYDROmorphone (DILAUDID) injection 1 mg ( Subcutaneous See Alternative 08/12/17 0246)  HYDROmorphone (DILAUDID) injection 1 mg (1 mg Intravenous Given 08/12/17 0411)    Or  HYDROmorphone (DILAUDID) injection 1 mg ( Subcutaneous See Alternative 08/12/17 0411)  diphenhydrAMINE (BENADRYL) injection 25 mg (25 mg Intravenous Given 08/12/17 0218)  cefTRIAXone (ROCEPHIN) 2 g in  sodium chloride 0.9 % 100 mL IVPB (0 g Intravenous Stopped 08/12/17 0250)  sodium chloride 0.9 % bolus 1,000 mL (0 mLs Intravenous Stopped 08/12/17 0520)    Mobility walks

## 2017-08-12 NOTE — H&P (Signed)
Lisa Tucker is an 22 y.o. female.   Chief Complaint: Body aches and Fever x 1 day  HPI: Patient is a 22 year old female with history of sickle cell disease who is Opiate [place]. She also has history of febrile illness with pericardial effusion as well as asthma during previous hospitalization. Patient reported to the ER with subjective fever. She was having shortness of breath and cough. No sputum production. Temperature went to 101 at home. Since then she's had generalized weakness body pain all over. Most recently it is settled in her back and legs. Pain feels like her typical sickle cell crisis. Patient has had sick contacts in school.Her pain at the moment is down to 7 out of 10. She has taken her home medications.  ED Course: patient treated with IV Dilaudid. Started her on IV Rocephin in the ER. Influenza screen was negative. She had reticulocyte count of 373 with blood cultures obtained. Urinalysis is negative her chest x-ray also negative. White count is 22.4 thousand with hemoglobin 7.2 which is close to her baseline. Lactic acid is 1.1  Past Medical History:  Diagnosis Date  . Asthma   . Pericardial effusion 2015  . Sickle cell anemia (HCC)    SS disease; has required [place] O2 for several months twice in the past (2013, 2017)    Past Surgical History:  Procedure Laterality Date  . APPENDECTOMY  2011    Family History  Problem Relation Age of Onset  . Hypertension Mother    Social History:  reports that she has never smoked. She has never used smokeless tobacco. She reports that she does not drink alcohol or use drugs.  Allergies: No Known Allergies   (Not in a hospital admission)  Results for orders placed or performed during the hospital encounter of 08/12/17 (from the past 48 hour(s))  CBC WITH DIFFERENTIAL     Status: Abnormal   Collection Time: 08/12/17  1:55 AM  Result Value Ref Range   WBC 22.4 (H) 4.0 - 10.5 K/uL   RBC 2.14 (L) 3.87 - 5.11 MIL/uL   Hemoglobin 7.2 (L)  12.0 - 15.0 g/dL   HCT 19.7 (L) 36.0 - 46.0 %   MCV 92.1 78.0 - 100.0 fL   MCH 33.6 26.0 - 34.0 pg   MCHC 36.5 (H) 30.0 - 36.0 g/dL   RDW 24.9 (H) 11.5 - 15.5 %   Platelets 512 (H) 150 - 400 K/uL   Neutrophils Relative % 82 %   Lymphocytes Relative 11 %   Monocytes Relative 5 %   Eosinophils Relative 2 %   Basophils Relative 0 %   Neutro Abs 18.4 (H) 1.7 - 7.7 K/uL   Lymphs Abs 2.5 0.7 - 4.0 K/uL   Monocytes Absolute 1.1 (H) 0.1 - 1.0 K/uL   Eosinophils Absolute 0.4 0.0 - 0.7 K/uL   Basophils Absolute 0.0 0.0 - 0.1 K/uL   RBC Morphology POLYCHROMASIA PRESENT     Comment: Sickle cells present Performed at [facility], [place] [address], [place], [place] [address]   Comprehensive metabolic panel     Status: Abnormal   Collection Time: 08/12/17  1:55 AM  Result Value Ref Range   Sodium 139 135 - 145 mmol/L   Potassium 3.9 3.5 - 5.1 mmol/L   Chloride 110 101 - 111 mmol/L   CO2 20 (L) 22 - 32 mmol/L   Glucose, Bld 84 65 - 99 mg/dL   BUN 5 (L) 6 - 20 mg/dL   Creatinine,  Ser 0.52 0.44 - 1.00 mg/dL   Calcium 9.0 8.9 - 10.3 mg/dL   Total Protein 7.7 6.5 - 8.1 g/dL   Albumin 4.6 3.5 - 5.0 g/dL   AST 37 15 - 41 U/L   ALT 17 14 - 54 U/L   Alkaline Phosphatase 58 38 - 126 U/L   Total Bilirubin 4.3 (H) 0.3 - 1.2 mg/dL   GFR calc non Af Amer >60 >60 mL/min   GFR calc Af Amer >60 >60 mL/min    Comment: (NOTE) The eGFR has been calculated using the CKD EPI equation. This calculation has not been validated in all clinical situations. eGFR's persistently <60 mL/min signify possible Chronic Kidney Disease.    Anion gap 9 5 - 15    Comment: Performed at [facility], [place] [address], [place], [place] [address]  Urinalysis, Routine w reflex microscopic     Status: Abnormal   Collection Time: 08/12/17  4:42 AM  Result Value Ref Range   Color, Urine YELLOW YELLOW   APPearance CLEAR CLEAR   Specific Gravity, Urine 1.011 1.005 - 1.030   pH 6.0 5.0 - 8.0    Glucose, UA NEGATIVE NEGATIVE mg/dL   Hgb urine dipstick MODERATE (A) NEGATIVE   Bilirubin Urine NEGATIVE NEGATIVE   Ketones, ur NEGATIVE NEGATIVE mg/dL   Protein, ur NEGATIVE NEGATIVE mg/dL   Nitrite NEGATIVE NEGATIVE   Leukocytes, UA NEGATIVE NEGATIVE   RBC / HPF 0-5 0 - 5 RBC/hpf   WBC, UA 0-5 0 - 5 WBC/hpf   Bacteria, UA RARE (A) NONE SEEN   Squamous Epithelial / LPF 0-5 (A) NONE SEEN   Mucus PRESENT     Comment: Performed at [facility], [place] [address], [place], [place] [address]  Influenza panel by PCR (type A & B)     Status: None   Collection Time: 08/12/17  4:42 AM  Result Value Ref Range   Influenza A By PCR NEGATIVE NEGATIVE   Influenza B By PCR NEGATIVE NEGATIVE    Comment: (NOTE) The Xpert Xpress Flu assay is intended as an aid in the diagnosis of  influenza and should not be used as a sole basis for treatment.  This  assay is FDA approved for nasopharyngeal swab specimens only. Nasal  washings and aspirates are unacceptable for Xpert Xpress Flu testing. Performed at [facility], [place] [address], [place], [place] [address]   I-Stat CG4 Lactic Acid, ED     Status: None   Collection Time: 08/12/17  4:55 AM  Result Value Ref Range   Lactic Acid, Venous 1.17 0.5 - 1.9 mmol/L   Dg Chest 2 View  Result Date: 08/12/2017 CLINICAL DATA:  Cough, chills, fever. EXAM: CHEST - 2 VIEW COMPARISON:  Radiographs and CT 02/07/2017 FINDINGS: Mild cardiomegaly is unchanged from prior exam. Mediastinal contours are unchanged. The lungs are clear. Pulmonary vasculature is normal. No consolidation, pleural effusion, or pneumothorax. No acute osseous abnormalities are seen. IMPRESSION: Stable mild cardiomegaly without acute abnormality. Electronically Signed   By: Jeb Levering M.D.   On: 08/12/2017 02:30    Review of Systems  Constitutional: Positive for chills and fever.  HENT: Negative.   Eyes: Negative.   Respiratory: Positive for cough.    Cardiovascular: Negative.   Gastrointestinal: Negative.   Musculoskeletal: Positive for joint pain and myalgias.  Skin: Negative.   Neurological: Negative.   Endo/Heme/Allergies: Negative.   Psychiatric/Behavioral: Negative.     Blood pressure 101/62, pulse 78,  temperature 98.5 F (36.9 C), temperature source Oral, resp. rate 12, height '5\' 5"'$  (1.651 m), weight 78.9 kg (174 lb), last menstrual period 08/04/2017, SpO2 (S) 100 %. Physical Exam  Constitutional: She is oriented to person, place, and time. She appears well-developed and well-nourished.  HENT:  Head: Normocephalic and atraumatic.  Eyes: Pupils are equal, round, and reactive to light. Conjunctivae are normal.  Neck: Normal range of motion. Neck supple.  Cardiovascular: Normal rate, regular rhythm and normal heart sounds.  Respiratory: Effort normal and breath sounds normal.  GI: Soft. Bowel sounds are normal.  Musculoskeletal: Normal range of motion.  Neurological: She is alert and oriented to person, place, and time.  Skin: Skin is warm and dry.  Psychiatric: She has a normal mood and affect.     Assessment/Plan A 22 year old female admitted with generalized fever and body aches.  #1 SIRS: Patient is to have SIRS with [place] of infection. I suspect this to be all secondary to sickle cell crisis. She is empirically on Rocephin. We'll keep her on Rocephin until cultures are back. If negative we will discontinue antibiotics.  #2 sickle cell painful crisis: Patient is only on OxyCodone PRN at home. I will start her on Dilaudid PCA with Toradol and IV fluids. Reassess pain in the more  #3 history of asthma: No exacerbation.Continue home inhalers.  #4 Leucocytosis: Probably due to sickle cell crisis. We'll also be due to bronchitis as patient is having upper respiratory tract symptoms. We'll monitor closely after hydration.  #5 cough: Patient reported cough with some rhinorrhea. Could be other viral infections. Influenza screen  is negative.      Barbette Merino, MD 08/12/2017, 8:27 AM

## 2017-08-13 DIAGNOSIS — R6510 Systemic inflammatory response syndrome (SIRS) of non-infectious origin without acute organ dysfunction: Secondary | ICD-10-CM | POA: Diagnosis not present

## 2017-08-13 LAB — COMPREHENSIVE METABOLIC PANEL
ALT: 14 U/L (ref 14–54)
ANION GAP: 8 (ref 5–15)
AST: 30 U/L (ref 15–41)
Albumin: 3.7 g/dL (ref 3.5–5.0)
Alkaline Phosphatase: 57 U/L (ref 38–126)
BILIRUBIN TOTAL: 1.7 mg/dL — AB (ref 0.3–1.2)
BUN: 5 mg/dL — AB (ref 6–20)
CHLORIDE: 110 mmol/L (ref 101–111)
CO2: 22 mmol/L (ref 22–32)
Calcium: 8.7 mg/dL — ABNORMAL LOW (ref 8.9–10.3)
Creatinine, Ser: 0.52 mg/dL (ref 0.44–1.00)
Glucose, Bld: 107 mg/dL — ABNORMAL HIGH (ref 65–99)
Potassium: 3.6 mmol/L (ref 3.5–5.1)
Sodium: 140 mmol/L (ref 135–145)
TOTAL PROTEIN: 6.7 g/dL (ref 6.5–8.1)

## 2017-08-13 LAB — CBC WITH DIFFERENTIAL/PLATELET
BASOS ABS: 0.1 10*3/uL (ref 0.0–0.1)
Basophils Relative: 1 %
EOS PCT: 6 %
Eosinophils Absolute: 0.8 10*3/uL — ABNORMAL HIGH (ref 0.0–0.7)
HEMATOCRIT: 16.9 % — AB (ref 36.0–46.0)
HEMOGLOBIN: 6.2 g/dL — AB (ref 12.0–15.0)
LYMPHS ABS: 3 10*3/uL (ref 0.7–4.0)
LYMPHS PCT: 21 %
MCH: 33.9 pg (ref 26.0–34.0)
MCHC: 36.7 g/dL — ABNORMAL HIGH (ref 30.0–36.0)
MCV: 92.3 fL (ref 78.0–100.0)
Monocytes Absolute: 0.8 10*3/uL (ref 0.1–1.0)
Monocytes Relative: 6 %
NEUTROS ABS: 9.6 10*3/uL — AB (ref 1.7–7.7)
NEUTROS PCT: 66 %
Platelets: 397 10*3/uL (ref 150–400)
RBC: 1.83 MIL/uL — ABNORMAL LOW (ref 3.87–5.11)
RDW: 23.1 % — AB (ref 11.5–15.5)
WBC: 14.3 10*3/uL — ABNORMAL HIGH (ref 4.0–10.5)

## 2017-08-13 LAB — PREPARE RBC (CROSSMATCH)

## 2017-08-13 NOTE — Progress Notes (Signed)
Pt discharged home in stable condition. Discharge instructions given. No immediate questions or concerns at this time. Pt opted to ambulate off unit.  

## 2017-08-13 NOTE — Progress Notes (Signed)
Pharmacy IV to PO conversion  The patient is ordered Diphenhydramine by the intravenous route with a linked PO option available.  Based on criteria approved by the Pharmacy and Therapeutics Committee and the Medical Executive Committee, the IV option is being discontinued.   Not prescribed to treat or prevent a severe allergic reaction   Not prescribed as premedication prior to receiving blood product, biologic medication, antimicrobial, or chemotherapy agent   The patient has tolerated at least one dose of an oral or enteral medication   The patient has no evidence of active gastrointestinal bleeding or impaired GI absorption (gastrectomy, short bowel, patient on TNA or NPO).   The patient is not undergoing procedural sedation  If you have any questions about this conversion, please contact the Pharmacy Department (ext [phone]).  Thank you.  Bernadene Personrew Ilyaas Musto, PharmD, BCPS [phone] 08/13/2017, 10:17 AM

## 2017-08-13 NOTE — Progress Notes (Signed)
CRITICAL VALUE ALERT  Critical Value:  Hemoglobin 6.2  Date & Time Notied:  08/13/17 0450  Provider Notified: Opyd 08/13/17 0458  Orders Received/Actions taken: order to prepare 1 unit RBC

## 2017-08-13 NOTE — Discharge Summary (Signed)
Physician Discharge Summary  Patient ID: Lisa Tucker MRN: [id number] DOB/AGE: 12-21-1995 21 y.o.  Admit date: 08/12/2017 Discharge date: 08/13/2017  Admission Diagnoses:  Discharge Diagnoses:  Principal Problem:   SIRS (systemic inflammatory response syndrome) (HCC) Active Problems:   Anemia of chronic disease   Sickle cell anemia with crisis (HCC)   Leucocytosis   Fever   Asthma   Discharged Condition: good  Hospital Course: patient is a 22 year old female admitted with sickle cell painful crisis.  She started having pain 8 out of 10.  Associated with weakness consistent with her sickle cell painful crisis.  She was placed on Dilaudid PCA with Toradol and IV fluids.  Patient was also mobilized.  Pain responded to treatment promptly.  It was down to 4 out of 10.  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  She was discharged home on oral pain medications  Consults: None  Significant Diagnostic Studies: labs: serial CBCs and CMPs were checked  Treatments: IV hydration and analgesia: acetaminophen and Dilaudid  Discharge Exam: Blood pressure (!) 91/57, pulse (!) 53, temperature 97.7 F (36.5 C), temperature source Oral, resp. rate 17, height 5\' 5"  (1.651 m), weight 78.9 kg (173 lb 15.1 oz), last menstrual period 08/04/2017, SpO2 99 %. General appearance: alert, cooperative and appears stated age Resp: clear to auscultation bilaterally Cardio: regular rate and rhythm, S1, S2 normal, no murmur, click, rub or gallop GI: soft, non-tender; bowel sounds normal; no masses,  no organomegaly Extremities: extremities normal, atraumatic, no cyanosis or edema Neurologic: Grossly normal  Disposition: Discharge disposition: 01-Home or Self Care       Discharge Instructions    Diet - low sodium heart healthy   Complete by:  As directed    Increase activity slowly   Complete by:  As directed      Allergies as of 08/13/2017   No Known Allergies     Medication List     TAKE these medications   albuterol 108 (90 Base) MCG/ACT inhaler Commonly known as:  PROVENTIL HFA;VENTOLIN HFA Inhale 2 puffs into the lungs every 6 (six) hours as needed for wheezing or shortness of breath.   beclomethasone 80 MCG/ACT inhaler Commonly known as:  QVAR Inhale 2 puffs into the lungs 2 (two) times daily.   etonogestrel 68 MG Impl implant Commonly known as:  NEXPLANON 1 each by Subdermal route once.   folic acid 1 MG tablet Commonly known as:  FOLVITE Take 1 mg by mouth daily.   hydroxyurea 500 MG capsule Commonly known as:  HYDREA Take 2,000-2,500 mg by mouth See admin instructions. Pt takes 4 caps (2000mg ) on Monday, Wednesday and Friday , takes 5 caps (2500mg ) by mouth other days..May take with food to minimize GI side effects. Alternates   montelukast 10 MG tablet Commonly known as:  SINGULAIR Take 10 mg by mouth daily.   oxycodone 5 MG capsule Commonly known as:  OXY-IR Take 5-10 mg by mouth every 4 (four) hours as needed for pain.   penicillin v potassium 250 MG tablet Commonly known as:  VEETID Take 250 mg by mouth 2 (two) times daily.   VITAMIN D (CHOLECALCIFEROL) PO Take 1 tablet by mouth daily. OTC, strength unknown        Signed: Murlene Revell,LAWAL 08/13/2017, 11:51 AM   Time spent 25 minutes

## 2017-08-14 NOTE — Progress Notes (Signed)
Pt was discharged and waste was needed from Dilaudid PCA . 21mg  was wasted from the syringe by this nurse and Lavonia Draftsandall Johnson, RN.

## 2017-08-17 LAB — CULTURE, BLOOD (ROUTINE X 2)
Culture: NO GROWTH
Culture: NO GROWTH
SPECIAL REQUESTS: ADEQUATE
Special Requests: ADEQUATE

## 2017-08-17 LAB — TYPE AND SCREEN
ABO/RH(D): O POS
ANTIBODY SCREEN: NEGATIVE
Unit division: 0

## 2017-08-17 LAB — BPAM RBC
BLOOD PRODUCT EXPIRATION DATE: 201905102359
UNIT TYPE AND RH: 5100

## 2017-11-12 ENCOUNTER — Emergency Department (HOSPITAL_COMMUNITY)
Admission: EM | Admit: 2017-11-12 | Discharge: 2017-11-12 | Disposition: A | Discharge status: Home / Self Care | Payer: Medicaid Other | Attending: Emergency Medicine | Admitting: Emergency Medicine

## 2017-11-12 ENCOUNTER — Other Ambulatory Visit: Payer: Self-pay

## 2017-11-12 ENCOUNTER — Emergency Department (HOSPITAL_COMMUNITY): Payer: Medicaid Other

## 2017-11-12 ENCOUNTER — Encounter (HOSPITAL_COMMUNITY): Payer: Self-pay

## 2017-11-12 DIAGNOSIS — J45909 Unspecified asthma, uncomplicated: Secondary | ICD-10-CM | POA: Diagnosis not present

## 2017-11-12 DIAGNOSIS — R701 Abnormal plasma viscosity: Secondary | ICD-10-CM | POA: Insufficient documentation

## 2017-11-12 DIAGNOSIS — Z79899 Other long term (current) drug therapy: Secondary | ICD-10-CM | POA: Insufficient documentation

## 2017-11-12 DIAGNOSIS — R0902 Hypoxemia: Secondary | ICD-10-CM | POA: Insufficient documentation

## 2017-11-12 LAB — COMPREHENSIVE METABOLIC PANEL
ALK PHOS: 55 U/L (ref 38–126)
ALT: 21 U/L (ref 0–44)
ANION GAP: 8 (ref 5–15)
AST: 40 U/L (ref 15–41)
Albumin: 4.1 g/dL (ref 3.5–5.0)
BUN: 5 mg/dL — ABNORMAL LOW (ref 6–20)
CALCIUM: 9 mg/dL (ref 8.9–10.3)
CHLORIDE: 111 mmol/L (ref 98–111)
CO2: 24 mmol/L (ref 22–32)
Creatinine, Ser: 0.56 mg/dL (ref 0.44–1.00)
GFR calc Af Amer: >60 mL/min (ref >60)
GFR calc non Af Amer: >60 mL/min (ref >60)
GLUCOSE: 90 mg/dL (ref 70–99)
Potassium: 3.7 mmol/L (ref 3.5–5.1)
SODIUM: 143 mmol/L (ref 135–145)
Total Bilirubin: 2.9 mg/dL — ABNORMAL HIGH (ref 0.3–1.2)
Total Protein: 7.1 g/dL (ref 6.5–8.1)

## 2017-11-12 LAB — URINALYSIS, ROUTINE W REFLEX MICROSCOPIC
BACTERIA UA: NONE SEEN
Bilirubin Urine: NEGATIVE
Glucose, UA: NEGATIVE mg/dL
Ketones, ur: NEGATIVE mg/dL
Leukocytes, UA: NEGATIVE
Nitrite: NEGATIVE
PROTEIN: 30 mg/dL — AB
SPECIFIC GRAVITY, URINE: 1.011 (ref 1.005–1.030)
pH: 6 (ref 5.0–8.0)

## 2017-11-12 LAB — CBC WITH DIFFERENTIAL/PLATELET
BASOS PCT: 1 %
Basophils Absolute: 0.1 10*3/uL (ref 0.0–0.1)
EOS ABS: 0.6 10*3/uL (ref 0.0–0.7)
EOS PCT: 6 %
HEMATOCRIT: 21.4 % — AB (ref 36.0–46.0)
HEMOGLOBIN: 7.6 g/dL — AB (ref 12.0–15.0)
LYMPHS PCT: 31 %
Lymphs Abs: 3.1 10*3/uL (ref 0.7–4.0)
MCH: 34.4 pg — AB (ref 26.0–34.0)
MCHC: 35.5 g/dL (ref 30.0–36.0)
MCV: 96.8 fL (ref 78.0–100.0)
MONOS PCT: 6 %
Monocytes Absolute: 0.6 10*3/uL (ref 0.1–1.0)
NEUTROS ABS: 5.7 10*3/uL (ref 1.7–7.7)
Neutrophils Relative %: 56 %
Platelets: 411 10*3/uL — ABNORMAL HIGH (ref 150–400)
RBC: 2.21 MIL/uL — ABNORMAL LOW (ref 3.87–5.11)
RDW: 23.8 % — ABNORMAL HIGH (ref 11.5–15.5)
WBC: 10.1 10*3/uL (ref 4.0–10.5)

## 2017-11-12 LAB — RETICULOCYTES
RBC.: 2.2 MIL/uL — ABNORMAL LOW (ref 3.87–5.11)
Retic Count, Absolute: 380.6 10*3/uL — ABNORMAL HIGH (ref 19.0–186.0)
Retic Ct Pct: 17.3 % — ABNORMAL HIGH (ref 0.4–3.1)

## 2017-11-12 LAB — I-STAT BETA HCG BLOOD, ED (MC, WL, AP ONLY)

## 2017-11-12 NOTE — ED Notes (Addendum)
Pt ambulated with pulse ox on ear.  No drop in O2.  Pt remained in upper 90's the entire time and denied and sob or chest pain.  PA notified.

## 2017-11-12 NOTE — ED Provider Notes (Addendum)
[facility] EMERGENCY DEPARTMENT Provider Note   CSN: [id number] Arrival date & time: 11/12/17  1516     History   Chief Complaint Chief Complaint  Patient presents with  . Low Pulse Oxygen    HPI Lisa Tucker is a 22 y.o. female.  HPI   Patient is a 22 year old female with a history of asthma, and sickle cell SS disease presenting for low pulse oxygen reading at home.  Patient reports that she periodically will have this reading without symptoms, and she notices it when she sees a blue tinge to her lips.  Patient reports that she saw this blue tinge this morning, placed the pulse ox on her finger, and it was reading in the 80s.  Patient then presented to the emergency department.  Patient denies any chest pain, shortness of breath, fevers, cough, syncope or presyncope.  Patient reports that she does have low pulse ox readings when she is in crisis.  Patient denies any symptoms of pain crisis at this time.  Patient has no history of DVT or PE.  Patient has no recent immobilizations, hospitalizations, recent surgery, or estrogen use.  Past Medical History:  Diagnosis Date  . Asthma   . Pericardial effusion 2015  . Sickle cell anemia (HCC)    SS disease; has required [place] O2 for several months twice in the past (2013, 2017)    Patient Active Problem List   Diagnosis Date Noted  . Sickle cell anemia with crisis (HCC) 08/12/2017  . SIRS (systemic inflammatory response syndrome) (HCC) 08/12/2017  . Leucocytosis 08/12/2017  . Fever 08/12/2017  . Asthma 08/12/2017  . Anemia of chronic disease 01/04/2017    Past Surgical History:  Procedure Laterality Date  . APPENDECTOMY  2011     OB History   None      Home Medications    Prior to Admission medications   Medication Sig Start Date End Date Taking? Authorizing Provider  albuterol (PROVENTIL HFA;VENTOLIN HFA) 108 (90 Base) MCG/ACT inhaler Inhale 2 puffs into the lungs every 6 (six) hours as needed for  wheezing or shortness of breath.     [provider]  beclomethasone (QVAR) 80 MCG/ACT inhaler Inhale 2 puffs into the lungs 2 (two) times daily.    [provider]  etonogestrel (NEXPLANON) 68 MG IMPL implant 1 each by Subdermal route once.    [provider]  folic acid (FOLVITE) 1 MG tablet Take 1 mg by mouth daily.    [provider]  hydroxyurea (HYDREA) 500 MG capsule Take 2,000-2,500 mg by mouth See admin instructions. Pt takes 4 caps (2000mg ) on Monday, Wednesday and Friday , takes 5 caps (2500mg ) by mouth other days..May take with food to minimize GI side effects. Alternates    [provider]  montelukast (SINGULAIR) 10 MG tablet Take 10 mg by mouth daily.    [provider]  oxycodone (OXY-IR) 5 MG capsule Take 5-10 mg by mouth every 4 (four) hours as needed for pain.     [provider]  penicillin v potassium (VEETID) 250 MG tablet Take 250 mg by mouth 2 (two) times daily.    [provider]  VITAMIN D, CHOLECALCIFEROL, PO Take 1 tablet by mouth daily. OTC, strength unknown    [provider]    Family History Family History  Problem Relation Age of Onset  . Hypertension Mother     Social History Social History   Tobacco Use  . Smoking status: Never  Smoker  . Smokeless tobacco: Never Used  Substance Use Topics  . Alcohol use: No  . Drug use: No     Allergies   Patient has no known allergies.   Review of Systems Review of Systems  Constitutional: Negative for chills and fever.  Respiratory: Negative for cough, chest tightness, shortness of breath and wheezing.   Cardiovascular: Negative for chest pain, palpitations and leg swelling.  Neurological: Negative for syncope and headaches.  All other systems reviewed and are negative.    Physical Exam Updated Vital Signs BP 106/61 (BP Location: Left Arm)   Pulse 65   Temp 98.4 F (36.9 C) (Oral)   Resp 16   Ht 5\' 4"  (1.626 m)   Wt  82.6 kg (182 lb)   LMP 11/05/2017   SpO2 96%   BMI 31.24 kg/m   Physical Exam  Constitutional: She appears well-developed and well-nourished. No distress.  HENT:  Head: Normocephalic and atraumatic.  Mouth/Throat: Oropharynx is clear and moist.  Eyes: Pupils are equal, round, and reactive to light. Conjunctivae and EOM are normal.  Neck: Normal range of motion. Neck supple.  Cardiovascular: Normal rate, regular rhythm, S1 normal and S2 normal.  No murmur heard. Pulmonary/Chest: Effort normal and breath sounds normal. She has no wheezes. She has no rales.  Abdominal: Soft. She exhibits no distension. There is no tenderness. There is no guarding.  Musculoskeletal: Normal range of motion. She exhibits no edema or deformity.  Lymphadenopathy:    She has no cervical adenopathy.  Neurological: She is alert.  Cranial nerves grossly intact. Patient moves extremities symmetrically and with good coordination.  Skin: Skin is warm and dry. Capillary refill takes 2 to 3 seconds. No rash noted. No erythema.  Psychiatric: She has a normal mood and affect. Her behavior is normal. Judgment and thought content normal.  Nursing note and vitals reviewed.    ED Treatments / Results  Labs (all labs ordered are listed, but only abnormal results are displayed) Labs Reviewed  COMPREHENSIVE METABOLIC PANEL - Abnormal; Notable for the following components:      Result Value   BUN 5 (*)    Total Bilirubin 2.9 (*)    All other components within normal limits  CBC WITH DIFFERENTIAL/PLATELET - Abnormal; Notable for the following components:   RBC 2.21 (*)    Hemoglobin 7.6 (*)    HCT 21.4 (*)    MCH 34.4 (*)    RDW 23.8 (*)    Platelets 411 (*)    All other components within normal limits  RETICULOCYTES - Abnormal; Notable for the following components:   Retic Ct Pct 17.3 (*)    RBC. 2.20 (*)    Retic Count, Absolute 380.6 (*)    All other components within normal limits  URINALYSIS, ROUTINE W  REFLEX MICROSCOPIC  I-STAT BETA HCG BLOOD, ED ([facility], [facility], [facility] ONLY)    EKG EKG Interpretation  Date/Time:  Friday November 12 2017 19:14:26 EDT Ventricular Rate:  74 PR Interval:  146 QRS Duration: 88 QT Interval:  424 QTC Calculation: 470 R Axis:   64 Text Interpretation:  Sinus rhythm with marked sinus arrhythmia Otherwise normal ECG Confirmed by Eber HongMiller, Brian ([id number]) on 11/16/2017 11:03:51 AM   Radiology Dg Chest 2 View  Result Date: 11/12/2017 CLINICAL DATA:  Hypoxia. EXAM: CHEST - 2 VIEW COMPARISON:  08/12/2017 FINDINGS: Stable mild cardiomegaly. Pulmonary vascularity is normal. Lungs are clear. No effusions. No bone abnormality. IMPRESSION: No acute abnormality.  Chronic mild cardiomegaly.  Electronically Signed   By: Francene Boyers M.D.   On: 11/12/2017 16:09    Procedures Procedures (including critical care time)  Medications Ordered in ED Medications - No data to display   Initial Impression / Assessment and Plan / ED Course  I have reviewed the triage vital signs and the nursing notes.  Pertinent labs & imaging results that were available during my care of the patient were reviewed by me and considered in my medical decision making (see chart for details).     Patient nontoxic-appearing, hemodynamically stable, no evidence of hypoxia or respiratory distress on exam.  Central readings on the forehead, demonstrate stable oxygen saturations 95 to 100%.  Examination of patient's extremities, capillary refill slightly delayed, and appears to have potential decreased peripheral blood flow problems.  Suspect that this may be the source of having low oxygen readings.  There is no evidence of central cyanosis on exam.  Clinically, patient does not have presentation consistent with acute chest syndrome, sickle cell pain crisis, or DVT or PE.  Hemoglobin at baseline.  See below.  Patient does have reticulocytosis today, similar and stable to when she was admitted for sickle cell pain  crisis in April 2019, but is without symptoms today.  Hemoglobin  Date Value Ref Range Status  11/12/2017 7.6 (L) 12.0 - 15.0 g/dL Final  16/02/9603 6.2 (LL) 12.0 - 15.0 g/dL Final    Comment:    REPEATED TO VERIFY CRITICAL RESULT CALLED TO, READ BACK BY AND VERIFIED WITH: Isaac Bliss RN  [phone] 08/13/17 A NAVARRO   08/12/2017 7.2 (L) 12.0 - 15.0 g/dL Final  81/19/1478 7.7 (L) 12.0 - 15.0 g/dL Final   Patient was simulated in the emergency department by me, and did not have significant desaturation nor felt short of breath or tachypneic.  Patient had readings down to 89 to 1%, but they were without good waveform.  Once waveform resolved, patient was above 95%.  Patient was given strict return precautions as well as instructions to follow-up with her hematologist via phone on Monday morning.  Feel the patient stable for discharge given no signs of acute cardia pulmonary disease causing the saturation readings on home monitor.  This is a shared visit with Dr. Geoffery Lyons. Patient was independently evaluated by this attending physician. Attending physician consulted in evaluation and discharge management.  Final Clinical Impressions(s) / ED Diagnoses   Final diagnoses:  Reticulocytosis  Hypoxic episode    ED Discharge Orders    None       Delia Chimes 11/13/17 0248    Blane Ohara, MD 11/16/17 0358    Elisha Ponder, PA-C 11/26/17 1056    Blane Ohara, MD 11/27/17 [phone]

## 2017-11-12 NOTE — ED Triage Notes (Signed)
Pt states that she noticed her lips were dark today so she checked her oxygen level at home and it was 80-83% States she felt fine during this time, no pain or SOB. Took a shower to see if it would come back up. When it did not she decided to come to ED. Pt does not wear O2 at home but has in the past. O2 in triage 92-95%. NAD. Pt speaking in full sentences.

## 2017-11-12 NOTE — Discharge Instructions (Addendum)
Please see the information and instructions below regarding your visit.  Your diagnoses today include:  1. Reticulocytosis   2. Hypoxic episode      Tests performed today include: See side panel of your discharge paperwork for testing performed today. Vital signs are listed at the bottom of these instructions.   Medications prescribed:    Take any prescribed medications only as prescribed, and any over the counter medications only as directed on the packaging.  Home care instructions:  Please follow any educational materials contained in this packet.   Follow-up instructions: Please help with your hematologist oncologist on Monday regarding this episode today.  Return instructions:  Please return to the Emergency Department if you experience worsening symptoms.  Please return the emergency department immediately if you develop chest pain, shortness of breath, dizziness or lightness refill you are going to pass out, or any other new or worsening symptoms. Please return if you have any other emergent concerns.  Additional Information:   Your vital signs today were: BP 106/61 (BP Location: Left Arm)    Pulse 65    Temp 98.4 F (36.9 C) (Oral)    Resp 16    Ht 5\' 4"  (1.626 m)    Wt 82.6 kg (182 lb)    LMP 11/05/2017    SpO2 96%    BMI 31.24 kg/m  If your blood pressure (BP) was elevated on multiple readings during this visit above 130 for the top number or above 80 for the bottom number, please have this repeated by your primary care provider within one month. --------------  Thank you for allowing us to participate in your care today.

## 2017-11-12 NOTE — ED Provider Notes (Signed)
MSE was initiated and I personally evaluated the patient and placed orders (if any) at  3:44 PM on November 12, 2017.  The patient appears stable so that the remainder of the MSE may be completed by another provider.  Patient placed in Quick Look pathway, seen and evaluated   Chief Complaint: low oxygen  HPI:   Patient, with a past medical history of asthma, sickle cell anemia, presents for low oxygen saturations.  She noticed that her lips were darker than usual today.  She checked her oxygen level at home and it was 80 to 83% on room air.  States that this is happened to her in the past and she usually has no symptoms.  She does not wear home oxygen.  She does not feel short of breath, is not having any chest pain, cough, URI symptoms.  States that when she gets sickle cell pain crisis, usually presents twice a year.  She will have URI symptoms, severe pain as well as low oxygen, low hemoglobin and leukocytosis.  She states that this does not feel similar.  Denies any hemoptysis, history of PE.  ROS: low oxygen  Physical Exam:   Gen: No distress  Neuro: Awake and Alert  Skin: Warm    Focused Exam: RRR. Lungs CTAB. No TTP of chest, abdomen.   Initiation of care has begun. The patient has been counseled on the process, plan, and necessity for staying for the completion/evaluation, and the remainder of the medical screening examination    Dietrich PatesKhatri, Newell Frater, PA-C 11/12/17 1546    Gerhard MunchLockwood, Robert, MD 11/12/17 2254

## 2018-06-30 ENCOUNTER — Emergency Department (HOSPITAL_COMMUNITY): Payer: Medicaid Other

## 2018-06-30 ENCOUNTER — Encounter (HOSPITAL_COMMUNITY): Payer: Self-pay | Admitting: Emergency Medicine

## 2018-06-30 ENCOUNTER — Observation Stay (HOSPITAL_COMMUNITY)
Admission: EM | Admit: 2018-06-30 | Discharge: 2018-07-01 | Disposition: A | Discharge status: Home / Self Care | Payer: Medicaid Other | Attending: Internal Medicine | Admitting: Internal Medicine

## 2018-06-30 ENCOUNTER — Other Ambulatory Visit: Payer: Self-pay

## 2018-06-30 DIAGNOSIS — Z7951 Long term (current) use of inhaled steroids: Secondary | ICD-10-CM | POA: Diagnosis not present

## 2018-06-30 DIAGNOSIS — Z79899 Other long term (current) drug therapy: Secondary | ICD-10-CM | POA: Insufficient documentation

## 2018-06-30 DIAGNOSIS — D649 Anemia, unspecified: Secondary | ICD-10-CM

## 2018-06-30 DIAGNOSIS — Z8249 Family history of ischemic heart disease and other diseases of the circulatory system: Secondary | ICD-10-CM | POA: Insufficient documentation

## 2018-06-30 DIAGNOSIS — D5700 Hb-SS disease with crisis, unspecified: Principal | ICD-10-CM | POA: Insufficient documentation

## 2018-06-30 DIAGNOSIS — Z9114 Patient's other noncompliance with medication regimen: Secondary | ICD-10-CM | POA: Diagnosis not present

## 2018-06-30 DIAGNOSIS — J4520 Mild intermittent asthma, uncomplicated: Secondary | ICD-10-CM | POA: Insufficient documentation

## 2018-06-30 DIAGNOSIS — D72829 Elevated white blood cell count, unspecified: Secondary | ICD-10-CM | POA: Insufficient documentation

## 2018-06-30 DIAGNOSIS — R079 Chest pain, unspecified: Secondary | ICD-10-CM | POA: Diagnosis not present

## 2018-06-30 DIAGNOSIS — Z793 Long term (current) use of hormonal contraceptives: Secondary | ICD-10-CM | POA: Diagnosis not present

## 2018-06-30 DIAGNOSIS — D571 Sickle-cell disease without crisis: Secondary | ICD-10-CM

## 2018-06-30 DIAGNOSIS — G894 Chronic pain syndrome: Secondary | ICD-10-CM | POA: Insufficient documentation

## 2018-06-30 DIAGNOSIS — R0989 Other specified symptoms and signs involving the circulatory and respiratory systems: Secondary | ICD-10-CM | POA: Diagnosis not present

## 2018-06-30 LAB — CBC
HCT: 18.7 % — ABNORMAL LOW (ref 36.0–46.0)
HEMOGLOBIN: 6.7 g/dL — AB (ref 12.0–15.0)
MCH: 34.7 pg — ABNORMAL HIGH (ref 26.0–34.0)
MCHC: 35.8 g/dL (ref 30.0–36.0)
MCV: 96.9 fL (ref 80.0–100.0)
PLATELETS: 475 10*3/uL — AB (ref 150–400)
RBC: 1.93 MIL/uL — ABNORMAL LOW (ref 3.87–5.11)
RDW: 26.2 % — ABNORMAL HIGH (ref 11.5–15.5)
WBC: 9.8 10*3/uL (ref 4.0–10.5)
nRBC: 1.8 % — ABNORMAL HIGH (ref 0.0–0.2)

## 2018-06-30 LAB — I-STAT TROPONIN, ED: TROPONIN I, POC: 0 ng/mL (ref 0.00–0.08)

## 2018-06-30 LAB — BASIC METABOLIC PANEL
Anion gap: 9 (ref 5–15)
BUN: 5 mg/dL — ABNORMAL LOW (ref 6–20)
CHLORIDE: 107 mmol/L (ref 98–111)
CO2: 21 mmol/L — AB (ref 22–32)
Calcium: 8.9 mg/dL (ref 8.9–10.3)
Creatinine, Ser: 0.56 mg/dL (ref 0.44–1.00)
GFR calc Af Amer: >60 mL/min (ref >60)
Glucose, Bld: 91 mg/dL (ref 70–99)
Potassium: 3.9 mmol/L (ref 3.5–5.1)
Sodium: 137 mmol/L (ref 135–145)

## 2018-06-30 LAB — RETICULOCYTES
Immature Retic Fract: 46.9 % — ABNORMAL HIGH (ref 2.3–15.9)
RBC.: 1.93 MIL/uL — ABNORMAL LOW (ref 3.87–5.11)
Retic Count, Absolute: 264.4 10*3/uL — ABNORMAL HIGH (ref 19.0–186.0)
Retic Ct Pct: 11.1 % — ABNORMAL HIGH (ref 0.4–3.1)

## 2018-06-30 LAB — I-STAT BETA HCG BLOOD, ED (MC, WL, AP ONLY): I-stat hCG, quantitative: <5 m[IU]/mL (ref <5)

## 2018-06-30 LAB — DIFFERENTIAL
Basophils Absolute: 0 10*3/uL (ref 0.0–0.1)
Basophils Relative: 0 %
Eosinophils Absolute: 0.3 10*3/uL (ref 0.0–0.5)
Eosinophils Relative: 3 %
Lymphocytes Relative: 31 %
Lymphs Abs: 3 10*3/uL (ref 0.7–4.0)
Monocytes Absolute: 0.6 10*3/uL (ref 0.1–1.0)
Monocytes Relative: 6 %
Neutro Abs: 5.7 10*3/uL (ref 1.7–7.7)
Neutrophils Relative %: 59 %

## 2018-06-30 LAB — PREPARE RBC (CROSSMATCH)

## 2018-06-30 MED ORDER — SODIUM CHLORIDE 0.45 % IV SOLN
INTRAVENOUS | Status: AC
  Administered 2018-07-01: 03:00:00 via INTRAVENOUS

## 2018-06-30 MED ORDER — PENICILLIN V POTASSIUM 250 MG PO TABS
250.0000 mg | ORAL_TABLET | Freq: Two times a day (BID) | ORAL | Status: DC
  Administered 2018-06-30 – 2018-07-01 (×2): 250 mg via ORAL
  Filled 2018-06-30 (×2): qty 1

## 2018-06-30 MED ORDER — SODIUM CHLORIDE 0.9 % IV BOLUS
1000.0000 mL | Freq: Once | INTRAVENOUS | Status: AC
  Administered 2018-06-30: 1000 mL via INTRAVENOUS

## 2018-06-30 MED ORDER — KETOROLAC TROMETHAMINE 15 MG/ML IJ SOLN
15.0000 mg | Freq: Once | INTRAMUSCULAR | Status: AC
  Administered 2018-06-30: 15 mg via INTRAVENOUS
  Filled 2018-06-30: qty 1

## 2018-06-30 MED ORDER — MONTELUKAST SODIUM 10 MG PO TABS
10.0000 mg | ORAL_TABLET | Freq: Every day | ORAL | Status: DC
  Administered 2018-07-01: 10 mg via ORAL
  Filled 2018-06-30: qty 1

## 2018-06-30 MED ORDER — SODIUM CHLORIDE 0.9% IV SOLUTION
Freq: Once | INTRAVENOUS | Status: AC
  Administered 2018-06-30: 23:00:00 via INTRAVENOUS

## 2018-06-30 MED ORDER — POLYETHYLENE GLYCOL 3350 17 G PO PACK: 17.0000 g | PACK | Freq: Every day | ORAL | Status: DC | PRN

## 2018-06-30 MED ORDER — DIPHENHYDRAMINE HCL 50 MG/ML IJ SOLN
25.0000 mg | Freq: Once | INTRAMUSCULAR | Status: AC
  Administered 2018-06-30: 25 mg via INTRAVENOUS
  Filled 2018-06-30: qty 1

## 2018-06-30 MED ORDER — SODIUM CHLORIDE (PF) 0.9 % IJ SOLN
INTRAMUSCULAR | Status: AC
  Filled 2018-06-30: qty 50

## 2018-06-30 MED ORDER — SENNOSIDES-DOCUSATE SODIUM 8.6-50 MG PO TABS
1.0000 | ORAL_TABLET | Freq: Two times a day (BID) | ORAL | Status: DC
  Administered 2018-06-30 – 2018-07-01 (×2): 1 via ORAL
  Filled 2018-06-30 (×2): qty 1

## 2018-06-30 MED ORDER — KETOROLAC TROMETHAMINE 15 MG/ML IJ SOLN: 15.0000 mg | Freq: Four times a day (QID) | INTRAMUSCULAR | Status: DC

## 2018-06-30 MED ORDER — HYDROXYUREA 500 MG PO CAPS
2500.0000 mg | ORAL_CAPSULE | Freq: Every day | ORAL | Status: DC
  Administered 2018-06-30: 2500 mg via ORAL
  Filled 2018-06-30: qty 5

## 2018-06-30 MED ORDER — PROCHLORPERAZINE EDISYLATE 10 MG/2ML IJ SOLN
10.0000 mg | Freq: Once | INTRAMUSCULAR | Status: AC
  Administered 2018-06-30: 10 mg via INTRAVENOUS
  Filled 2018-06-30: qty 2

## 2018-06-30 MED ORDER — FOLIC ACID 1 MG PO TABS
1.0000 mg | ORAL_TABLET | Freq: Every day | ORAL | Status: DC
  Administered 2018-07-01: 1 mg via ORAL
  Filled 2018-06-30: qty 1

## 2018-06-30 MED ORDER — ALBUTEROL SULFATE (2.5 MG/3ML) 0.083% IN NEBU: 2.5000 mg | INHALATION_SOLUTION | Freq: Four times a day (QID) | RESPIRATORY_TRACT | Status: DC | PRN

## 2018-06-30 MED ORDER — BECLOMETHASONE DIPROPIONATE 80 MCG/ACT IN AERS: 2.0000 | INHALATION_SPRAY | Freq: Two times a day (BID) | RESPIRATORY_TRACT | Status: DC

## 2018-06-30 MED ORDER — BUDESONIDE 0.5 MG/2ML IN SUSP
0.5000 mg | Freq: Two times a day (BID) | RESPIRATORY_TRACT | Status: DC
  Administered 2018-06-30 – 2018-07-01 (×2): 0.5 mg via RESPIRATORY_TRACT
  Filled 2018-06-30 (×2): qty 2

## 2018-06-30 MED ORDER — IOPAMIDOL (ISOVUE-370) INJECTION 76%
INTRAVENOUS | Status: AC
  Filled 2018-06-30: qty 100

## 2018-06-30 MED ORDER — IOPAMIDOL (ISOVUE-370) INJECTION 76%
100.0000 mL | Freq: Once | INTRAVENOUS | Status: AC | PRN
  Administered 2018-06-30: 100 mL via INTRAVENOUS

## 2018-06-30 NOTE — ED Notes (Signed)
This Clinical research associate attempt once to collect labs in [facility]. unsuccessful

## 2018-06-30 NOTE — ED Notes (Signed)
Stacy RN at bedside attempting IV access

## 2018-06-30 NOTE — Progress Notes (Signed)
Pt and family req a delay in transport to fourth floor for RN to admin blood

## 2018-06-30 NOTE — ED Triage Notes (Signed)
Pt reports came out of restroom when got sudden central to left sided chest tightness with SOB and dizziness.

## 2018-06-30 NOTE — Progress Notes (Signed)
Report received at 1806

## 2018-06-30 NOTE — H&P (Signed)
H&P  Patient Demographics:  Lisa Tucker, is a 23 y.o. female  MRN: [id number]   DOB - Dec 16, 1995  Admit Date - 06/30/2018  Outpatient Primary MD for the patient is Cabarrus, Joslyn Hy, MD  Chief Complaint  Patient presents with  . Chest Pain  . Shortness of Breath      HPI:   Lisa Tucker  is a 23 y.o. female with a history of sickle cell anemia, hemoglobin SS and mild intermittent asthma presented to the emergency department with left chest pain and shortness of breath.  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  Patient has infrequent sickle cell pain crises.  She states that her last crisis was in April 2019.  Patient is followed by hematology and consistently takes folic acid and hydroxyurea.  Patient is opiate nave.  She has oxycodone 5 mg for pain control, but typically takes ibuprofen and/or Tylenol. Patient states that shortness of breath and pain have decreased.  She endorses fatigue.  She denies fever, chills, or persistent coughing.  She also denies dysuria, nausea, vomiting, or diarrhea.  ER course: Patient's hemoglobin is 6.7, which is below patient's baseline.  Patient's baseline is typically 7.0-8.0 g/dL.  Troponin negative.  Chest x-ray shows no acute cardiopulmonary process.  CT angiogram shows no demonstrable pulmonary embolus.  No thoracic aortic aneurysm or dissection patient has a degree of pulmonary vascular congestion, areas of scattered atelectatic change.  No evident thoracic adenopathy, suspect early thoracic endplate infarction several levels.  Most bony structures appear normal, atrophic hyperdense spleen, consistent with known sickle cell disease.  Pain resolved with supplemental oxygen, IV Toradol, IV fluids, IV Benadryl, and IV Compazine.  Will admit overnight for  observation for symptomatic anemia and transfuse 1 unit of packed red blood cells.     Review of systems:  Review of Systems  Constitutional: Positive for malaise/fatigue. Negative for fever.  HENT: Negative.   Eyes: Negative.   Respiratory: Negative.   Cardiovascular: Positive for chest pain.  Gastrointestinal: Negative.   Genitourinary: Negative.   Musculoskeletal: Positive for joint pain.  Skin: Negative.   Neurological: Negative.   Endo/Heme/Allergies: Negative.   Psychiatric/Behavioral: Negative.    A full 10 point Review of Systems was done, except as stated above, all other Review of Systems were negative.  With Past History of the following :   Past Medical History:  Diagnosis Date  . Asthma   . Pericardial effusion 2015  . Sickle cell anemia (HCC)    SS disease; has required [place] O2 for several months twice in the past (2013, 2017)      Past Surgical History:  Procedure Laterality Date  . APPENDECTOMY  2011     Social History:   Social History   Tobacco Use  . Smoking status: Never Smoker  . Smokeless tobacco: Never Used  Substance Use Topics  . Alcohol use: No     Lives - At home   Family History :   Family History  Problem Relation Age of Onset  . Hypertension Mother      Home Medications:   Prior to Admission medications   Medication Sig Start Date End Date Taking? Authorizing Provider  albuterol (PROVENTIL HFA;VENTOLIN HFA) 108 (90 Base) MCG/ACT inhaler Inhale 2 puffs into the lungs every 6 (six) hours as  needed for wheezing or shortness of breath.    Yes [provider]  beclomethasone (QVAR) 80 MCG/ACT inhaler Inhale 2 puffs into the lungs 2 (two) times daily.   Yes [provider]  Cholecalciferol (VITAMIN D3) 25 MCG (1000 UT) CAPS Take 1,000 Units by mouth daily.   Yes [provider]  etonogestrel (NEXPLANON) 68 MG IMPL implant 1 each by Subdermal route once.   Yes [provider]  folic acid (FOLVITE)  1 MG tablet Take 1 mg by mouth daily.   Yes [provider]  hydroxyurea (HYDREA) 500 MG capsule Take 2,500 mg by mouth at bedtime.    Yes [provider]  montelukast (SINGULAIR) 10 MG tablet Take 10 mg by mouth daily.   Yes [provider]  oxyCODONE (OXY IR/ROXICODONE) 5 MG immediate release tablet Take 10 mg by mouth every 4 (four) hours as needed. 01/25/18  Yes [provider]  penicillin v potassium (VEETID) 250 MG tablet Take 250 mg by mouth 2 (two) times daily.   Yes [provider]     Allergies:   No Known Allergies   Physical Exam:   Vitals:   Vitals:   06/30/18 1600 06/30/18 1630  BP: 104/71 103/71  Pulse: 63 66  Resp: 16 17  Temp:    SpO2: 97% 93%    Physical Exam: Constitutional: Patient appears well-developed and well-nourished. Not in obvious distress. HENT: Normocephalic, atraumatic, External right and left ear normal. Oropharynx is clear and moist.  Eyes: Conjunctivae and EOM are normal. PERRLA, no scleral icterus. Neck: Normal ROM. Neck supple. No JVD. No tracheal deviation. No thyromegaly. CVS: RRR, S1/S2 +, no murmurs, no gallops, no carotid bruit.  Pulmonary: Effort and breath sounds normal, no stridor, rhonchi, wheezes, rales.  Abdominal: Soft. BS +, no distension, tenderness, rebound or guarding.  Musculoskeletal: Normal range of motion. No edema and no tenderness.  Lymphadenopathy: No lymphadenopathy noted, cervical, inguinal or axillary Neuro: Alert. Normal reflexes, muscle tone coordination. No cranial nerve deficit. Skin: Skin is warm and dry. No rash noted. Not diaphoretic. No erythema. No pallor. Psychiatric: Normal mood and affect. Behavior, judgment, thought content normal.   Data Review:   CBC Recent Labs  Lab 06/30/18 1233  WBC 9.8  HGB 6.7*  HCT 18.7*  PLT 475*  MCV 96.9  MCH 34.7*  MCHC 35.8  RDW 26.2*  LYMPHSABS 3.0  MONOABS 0.6  EOSABS 0.3  BASOSABS 0.0    ------------------------------------------------------------------------------------------------------------------  Chemistries  Recent Labs  Lab 06/30/18 1233  NA 137  K 3.9  CL 107  CO2 21*  GLUCOSE 91  BUN 5*  CREATININE 0.56  CALCIUM 8.9   ------------------------------------------------------------------------------------------------------------------ estimated creatinine clearance is 115.9 mL/min (by C-G formula based on SCr of 0.56 mg/dL). ------------------------------------------------------------------------------------------------------------------ No results for input(s): TSH, T4TOTAL, T3FREE, THYROIDAB in the last 72 hours.  Invalid input(s): FREET3  Coagulation profile No results for input(s): INR, PROTIME in the last 168 hours. ------------------------------------------------------------------------------------------------------------------- No results for input(s): DDIMER in the last 72 hours. -------------------------------------------------------------------------------------------------------------------  Cardiac Enzymes No results for input(s): CKMB, TROPONINI, MYOGLOBIN in the last 168 hours.  Invalid input(s): CK ------------------------------------------------------------------------------------------------------------------ No results found for: BNP  ---------------------------------------------------------------------------------------------------------------  Urinalysis    Component Value Date/Time   COLORURINE YELLOW 11/12/2017 1725   APPEARANCEUR CLEAR 11/12/2017 1725   LABSPEC 1.011 11/12/2017 1725   PHURINE 6.0 11/12/2017 1725   GLUCOSEU NEGATIVE 11/12/2017 1725   HGBUR MODERATE (A) 11/12/2017 1725   BILIRUBINUR NEGATIVE 11/12/2017 1725   KETONESUR  NEGATIVE 11/12/2017 1725   PROTEINUR 30 (A) 11/12/2017 1725   NITRITE NEGATIVE 11/12/2017 1725   LEUKOCYTESUR NEGATIVE 11/12/2017 1725     ----------------------------------------------------------------------------------------------------------------   Imaging Results:    Dg Chest 2 View  Result Date: 06/30/2018 CLINICAL DATA:  Chest pain and shortness of breath. History of sickle cell anemia. EXAM: CHEST - 2 VIEW COMPARISON:  11/12/2017 FINDINGS: The cardiac silhouette remains mildly enlarged. The lungs are well inflated without evidence of airspace consolidation, edema, pleural effusion, or pneumothorax. No acute osseous abnormality is identified. IMPRESSION: No active cardiopulmonary disease. Electronically Signed   By: Sebastian Ache M.D.   On: 06/30/2018 12:05   Ct Angio Chest Pe W Or Wo Contrast  Result Date: 06/30/2018 CLINICAL DATA:  Shortness of breath and chest tightness. Sickle cell disease EXAM: CT ANGIOGRAPHY CHEST WITH CONTRAST TECHNIQUE: Multidetector CT imaging of the chest was performed using the standard protocol during bolus administration of intravenous contrast. Multiplanar CT image reconstructions and MIPs were obtained to evaluate the vascular anatomy. CONTRAST:  ISOVUE-370 IOPAMIDOL (ISOVUE-370) INJECTION 76% COMPARISON:  CT angiogram chest February 07, 2017; chest radiograph June 30, 2018 FINDINGS: Cardiovascular: There is no demonstrable pulmonary embolus. There is no thoracic aortic aneurysm or dissection. Visualized great vessels appear normal. Note that the right innominate and left common carotid arteries arise as a common trunk, an anatomic variant. There is mild cardiomegaly. There is no pericardial effusion or pericardial thickening. Mediastinum/Nodes: Thyroid appears unremarkable. There is residual thymic tissue, normal for age. There is no appreciable thoracic adenopathy. No esophageal lesions are evident. Lungs/Pleura: There are areas of mild atelectatic change. There is mild generalized pulmonary vascular prominence in the upper lobes consistent with a degree of pulmonary vascular congestion.  There is no frank edema or consolidation. No pleural effusion or pleural thickening. Upper Abdomen: The spleen is atrophic and shows diffuse increased attenuation, typical appearance secondary to sickle cell disease. Visualized upper abdominal structures otherwise appear normal. Musculoskeletal: There is slight endplate irregularity in several thoracic vertebral bodies which may represent areas of early infarction. Bony structures otherwise appear unremarkable. Review of the MIP images confirms the above findings. IMPRESSION: 1. No demonstrable pulmonary embolus. No thoracic aortic aneurysm or dissection. There is a degree of pulmonary vascular congestion. 2. Areas of scattered atelectatic change. No frank edema or consolidation. 3.  No evident thoracic adenopathy. 4. Suspect early thoracic endplate infarcts at several levels. Most bony structures appear normal. 5. Atrophic hyperdense spleen, consistent with known sickle cell disease. Electronically Signed   By: Bretta Bang III M.D.   On: 06/30/2018 15:28     Assessment & Plan:  Active Problems:   Symptomatic anemia   Sickle cell anemia (HCC)  Symptomatic anemia: Admit to inpatient services and observation. Hemoglobin 6.7, which is below patient's baseline of 7.0-8.0 g/dL. Transfuse 1 unit of packed red blood cells CBC in a.m. Maintain oxygen saturation above 90% on 2 L  Sickle cell anemia: Continue hydroxyurea and folic acid Pain control with oxycodone IR 5 mg every 4 hours as needed for moderate to severe breakthrough pain IV Toradol 15 mg every 6 hours 0.45% saline at 75 mL/h Reevaluate pain scale regularly in the context of functioning Monitor vital signs consistently 1. Hb Sickle Cell Disease with crisis: Admit, start IVF D5 .45% Saline @ 125 mls/hour, Weight based Dilaudid PCA started within 30 minutes of admission, IV Toradol 30 mg Q 6 H, Monitor vitals very closely, Re-evaluate pain scale regularly, 2 L of Oxygen by  [place], Patient  will be re-evaluated for pain in the context of function and relationship to baseline as care progresses. 2. Leukocytosis:  3. Sickle Cell Anemia:  4. Chronic pain Syndrome:  5. Medication non-compliance  DVT Prophylaxis: Subcut Lovenox   AM Labs Ordered, also please review Full Orders  Family Communication: Admission, patient's condition and plan of care including tests being ordered have been discussed with the patient who indicate understanding and agree with the plan and Code Status.  Code Status: Full Code  Consults called: None    Admission status: Inpatient    Time spent in minutes : 50 minutes  Nolon Nations  APRN, MSN, FNP-C [facility] [address] [place], [place] [address] [phone]  06/30/2018 at 5:05 PM

## 2018-06-30 NOTE — ED Notes (Signed)
Patient transported to CT 

## 2018-06-30 NOTE — ED Notes (Signed)
Patient transported to X-ray 

## 2018-06-30 NOTE — ED Notes (Signed)
Stacy RN unable to get IV access. Messaged Dr Pilar Plate about chaning Toradol to Im instead of IV.

## 2018-06-30 NOTE — ED Provider Notes (Addendum)
[facility] Emergency Department Provider Note MRN:  [id number]  Arrival date & time: 06/30/18     Chief Complaint   Chest Pain and Shortness of Breath   History of Present Illness   Lisa Tucker is a 23 y.o. year-old female with a history of sickle cell disease, pericardial effusion presenting to the ED with chief complaint of chest pain.  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  Tried to sit down and rest, symptoms only worsen.  Was evaluated by her clinical instructor, reportedly felt that her pulse was rapid.  Denies headache or vision change, no abdominal pain, pain is worse with deep breathing.  Review of Systems  A complete 10 system review of systems was obtained and all systems are negative except as noted in the HPI and PMH.   Patient's Health History    Past Medical History:  Diagnosis Date  . Asthma   . Pericardial effusion 2015  . Sickle cell anemia (HCC)    SS disease; has required [place] O2 for several months twice in the past (2013, 2017)    Past Surgical History:  Procedure Laterality Date  . APPENDECTOMY  2011    Family History  Problem Relation Age of Onset  . Hypertension Mother     Social History   Socioeconomic History  . Marital status: Single    Spouse name: Not on file  . Number of children: Not on file  . Years of education: Not on file  . Highest education level: Not on file  Occupational History  . Occupation: Archivist  Social Needs  . Financial resource strain: Not on file  . Food insecurity:    Worry: Not on file    Inability: Not on file  . Transportation needs:    Medical: Not on file    Non-medical: Not on file  Tobacco Use  . Smoking status: Never Smoker  . Smokeless tobacco: Never Used  Substance and Sexual Activity  . Alcohol use: No  . Drug use: No  . Sexual activity: Not  on file  Lifestyle  . Physical activity:    Days per week: Not on file    Minutes per session: Not on file  . Stress: Not on file  Relationships  . Social connections:    Talks on phone: Not on file    Gets together: Not on file    Attends religious service: Not on file    Active member of club or organization: Not on file    Attends meetings of clubs or organizations: Not on file    Relationship status: Not on file  . Intimate partner violence:    Fear of current or ex partner: Not on file    Emotionally abused: Not on file    Physically abused: Not on file    Forced sexual activity: Not on file  Other Topics Concern  . Not on file  Social History Narrative  . Not on file     Physical Exam  Vital Signs and Nursing Notes reviewed Vitals:   06/30/18 1430 06/30/18 1547  BP: 116/80 108/64  Pulse: 65 66  Resp: 14 14  Temp:    SpO2: 97% 97%    CONSTITUTIONAL: Well-appearing, NAD NEURO:  Alert and oriented x 3, no focal deficits EYES:  eyes equal and reactive ENT/NECK:  no LAD,  no JVD CARDIO: Regular rate, well-perfused, normal S1 and S2 PULM:  CTAB no wheezing or rhonchi GI/GU:  normal bowel sounds, non-distended, non-tender MSK/SPINE:  No gross deformities, no edema SKIN:  no rash, atraumatic PSYCH:  Appropriate speech and behavior  Diagnostic and Interventional Summary    EKG Interpretation  Date/Time:  Thursday June 30 2018 11:29:59 EST Ventricular Rate:  80 PR Interval:    QRS Duration: 89 QT Interval:  476 QTC Calculation: 550 R Axis:   67 Text Interpretation:  Sinus rhythm Prolonged QT interval Confirmed by Kennis Carina [phone]) on 06/30/2018 11:36:43 AM      Labs Reviewed  BASIC METABOLIC PANEL - Abnormal; Notable for the following components:      Result Value   CO2 21 (*)    BUN 5 (*)    All other components within normal limits  CBC - Abnormal; Notable for the following components:   RBC 1.93 (*)    Hemoglobin 6.7 (*)    HCT 18.7 (*)     MCH 34.7 (*)    RDW 26.2 (*)    Platelets 475 (*)    nRBC 1.8 (*)    All other components within normal limits  RETICULOCYTES - Abnormal; Notable for the following components:   Retic Ct Pct 11.1 (*)    RBC. 1.93 (*)    Retic Count, Absolute 264.4 (*)    Immature Retic Fract 46.9 (*)    All other components within normal limits  DIFFERENTIAL  I-STAT TROPONIN, ED  I-STAT BETA HCG BLOOD, ED ([facility], [facility], [facility] ONLY)  TYPE AND SCREEN    CT ANGIO CHEST PE W OR WO CONTRAST  Final Result    DG Chest 2 View  Final Result      Medications  sodium chloride (PF) 0.9 % injection (has no administration in time range)  iopamidol (ISOVUE-370) 76 % injection (has no administration in time range)  diphenhydrAMINE (BENADRYL) injection 25 mg (has no administration in time range)  prochlorperazine (COMPAZINE) injection 10 mg (has no administration in time range)  sodium chloride 0.9 % bolus 1,000 mL (1,000 mLs Intravenous New Bag/Given (Non-Interop) 06/30/18 1454)  ketorolac (TORADOL) 15 MG/ML injection 15 mg (15 mg Intravenous Given 06/30/18 1455)  iopamidol (ISOVUE-370) 76 % injection 100 mL (100 mLs Intravenous Contrast Given 06/30/18 1508)     Procedures Emergency Ultrasound Study:   Angiocath insertion Performed by: Sabas Sous  Consent: Verbal consent obtained. Risks and benefits: risks, benefits and alternatives were discussed Immediately prior to procedure the correct patient, procedure, equipment, support staff and site/side marked as needed.  Indication: difficult IV access Preparation: Patient was prepped and draped in the usual sterile fashion. Vein Location: The right basilic vein was visualized during assessment for potential access sites and was found to be patent/ easily compressed with linear ultrasound.  The needle was visualized with real-time ultrasound and guided into the vein. Gauge: 20  Images were not saved due to the time sensitive nature of the situation. Normal blood  return.  Patient tolerance: Patient tolerated the procedure well with no immediate complications.  Critical Care Critical Care Documentation Critical care time provided by me (excluding procedures): 37 minutes  Condition necessitating critical care: symptomatic anemia requiring admission and transfusion  Components of critical care management: reviewing of prior records, laboratory and imaging interpretation, frequent re-examination and reassessment of vital signs, discussion with admitting services   ED Course and Medical Decision Making  I have reviewed the triage vital signs and  the nursing notes.  Pertinent labs & imaging results that were available during my care of the patient were reviewed by me and considered in my medical decision making (see below for details).  Thought to be moderate to high risk for PE given her history of sickle cell disease, CTA pending.  Also considering acute chest, though patient does not report recent fever or cough.  EKG is without acute concerns.  CTA is without signs of pulmonary embolism.  Labs reveal hemoglobin of 6.7 with low reticulocyte count, accepted for admission by the sickle cell team.  Type and screen pending.  Elmer Sow. Pilar Plate, MD [facility] Emergency Medicine [facility] [email or web address]  Final Clinical Impressions(s) / ED Diagnoses     ICD-10-CM   1. Chest pain, unspecified type R07.9   2. Anemia, unspecified type D64.9   3. Hb-SS disease with crisis [facility]) D57.00     ED Discharge Orders    None         Sabas Sous, MD 06/30/18 1558    Sabas Sous, MD 07/07/18 [phone]

## 2018-06-30 NOTE — ED Notes (Signed)
ED TO INPATIENT HANDOFF REPORT  Name/Age/Gender Lisa Tucker 23 y.o. female  Code Status Code Status History    Date Active Date Inactive Code Status Order ID Comments User Context   08/12/2017 1048 08/13/2017 2021 Full Code [id number]  Rometta Emery, MD Inpatient   01/03/2017 2256 01/05/2017 1452 Full Code [id number]  Jonah Blue, MD Inpatient      Home/SNF/Other Home  Chief Complaint chest pain;shob  Level of Care/Admitting Diagnosis ED Disposition    ED Disposition Condition Comment   Admit  Hospital Area: [facility] [[id number]]  Level of Care: Med-Surg [16]  Diagnosis: Symptomatic anemia [2774128]  Admitting Physician: Quentin Angst [[id number]]  Attending Physician: Quentin Angst [[id number]]  PT Class (Do Not Modify): Observation [104]  PT Acc Code (Do Not Modify): Observation [10022]       Medical History Past Medical History:  Diagnosis Date  . Asthma   . Pericardial effusion 2015  . Sickle cell anemia (HCC)    SS disease; has required [facility] for several months twice in the past (2013, 2017)    Allergies No Known Allergies  IV Location/Drains/Wounds Patient Lines/Drains/Airways Status   Active Line/Drains/Airways    Name:   Placement date:   Placement time:   Site:   Days:   Peripheral IV 06/30/18 Right;Upper Arm   06/30/18    1452    Arm   less than 1          Labs/Imaging Results for orders placed or performed during the hospital encounter of 06/30/18 (from the past 48 hour(s))  Basic metabolic panel     Status: Abnormal   Collection Time: 06/30/18 12:33 PM  Result Value Ref Range   Sodium 137 135 - 145 mmol/L   Potassium 3.9 3.5 - 5.1 mmol/L   Chloride 107 98 - 111 mmol/L   CO2 21 (L) 22 - 32 mmol/L   Glucose, Bld 91 70 - 99 mg/dL   BUN 5 (L) 6 - 20 mg/dL   Creatinine, Ser 2.83 0.44 - 1.00 mg/dL   Calcium 8.9 8.9 - 66.2 mg/dL   GFR calc non Af Amer >60 >60 mL/min   GFR calc Af Amer >60 >60 mL/min   Anion gap 9 5  - 15    Comment: Performed at [facility], [address] [place], [place] [address]  CBC     Status: Abnormal   Collection Time: 06/30/18 12:33 PM  Result Value Ref Range   WBC 9.8 4.0 - 10.5 K/uL   RBC 1.93 (L) 3.87 - 5.11 MIL/uL   Hemoglobin 6.7 (LL) 12.0 - 15.0 g/dL    Comment: This critical result has verified and been called to [facility]. RN by Patrina Levering on 02 27 2020 at 1303, and has been read back. CRITICAL RESULT VERIFIED   HCT 18.7 (L) 36.0 - 46.0 %   MCV 96.9 80.0 - 100.0 fL   MCH 34.7 (H) 26.0 - 34.0 pg   MCHC 35.8 30.0 - 36.0 g/dL   RDW 46.5 (H) 03.5 - 46.5 %   Platelets 475 (H) 150 - 400 K/uL   nRBC 1.8 (H) 0.0 - 0.2 %    Comment: Performed at [facility], [address], [place], [place] [address]  Differential     Status: None   Collection Time: 06/30/18 12:33 PM  Result Value Ref Range   Neutrophils Relative % 59 %   Neutro Abs 5.7 1.7 - 7.7 K/uL  Lymphocytes Relative 31 %   Lymphs Abs 3.0 0.7 - 4.0 K/uL   Monocytes Relative 6 %   Monocytes Absolute 0.6 0.1 - 1.0 K/uL   Eosinophils Relative 3 %   Eosinophils Absolute 0.3 0.0 - 0.5 K/uL   Basophils Relative 0 %   Basophils Absolute 0.0 0.0 - 0.1 K/uL    Comment: Performed at [facility], [address] [place], [place] [address]  Reticulocytes     Status: Abnormal   Collection Time: 06/30/18 12:33 PM  Result Value Ref Range   Retic Ct Pct 11.1 (H) 0.4 - 3.1 %    Comment: VERIFIED BY MANUAL DILUTION   RBC. 1.93 (L) 3.87 - 5.11 MIL/uL   Retic Count, Absolute 264.4 (H) 19.0 - 186.0 K/uL   Immature Retic Fract 46.9 (H) 2.3 - 15.9 %    Comment: Performed at [facility], [address], [place], [place] [address]  I-stat troponin, ED     Status: None   Collection Time: 06/30/18 12:37 PM  Result Value Ref Range   Troponin i, poc 0.00 0.00 - 0.08 ng/mL   Comment 3            Comment: Due to the release kinetics of cTnI, a  negative result within the first hours of the onset of symptoms does not rule out myocardial infarction with certainty. If myocardial infarction is still suspected, repeat the test at appropriate intervals.   I-Stat beta hCG blood, ED     Status: None   Collection Time: 06/30/18 12:37 PM  Result Value Ref Range   I-stat hCG, quantitative <5.0 <5 mIU/mL   Comment 3            Comment:   GEST. AGE      CONC.  (mIU/mL)   <=1 WEEK        5 - 50     2 WEEKS       50 - 500     3 WEEKS       100 - 10,000     4 WEEKS     1,000 - 30,000        FEMALE AND NON-PREGNANT FEMALE:     LESS THAN 5 mIU/mL   Type and screen [facility]     Status: None   Collection Time: 06/30/18  3:51 PM  Result Value Ref Range   ABO/RH(D) O POS    Antibody Screen NEG    Sample Expiration      07/03/2018 Performed at [facility], [address], [place], [place] [address]    Dg Chest 2 View  Result Date: 06/30/2018 CLINICAL DATA:  Chest pain and shortness of breath. History of sickle cell anemia. EXAM: CHEST - 2 VIEW COMPARISON:  11/12/2017 FINDINGS: The cardiac silhouette remains mildly enlarged. The lungs are well inflated without evidence of airspace consolidation, edema, pleural effusion, or pneumothorax. No acute osseous abnormality is identified. IMPRESSION: No active cardiopulmonary disease. Electronically Signed   By: Sebastian Ache M.D.   On: 06/30/2018 12:05   Ct Angio Chest Pe W Or Wo Contrast  Result Date: 06/30/2018 CLINICAL DATA:  Shortness of breath and chest tightness. Sickle cell disease EXAM: CT ANGIOGRAPHY CHEST WITH CONTRAST TECHNIQUE: Multidetector CT imaging of the chest was performed using the standard protocol during bolus administration of intravenous contrast. Multiplanar CT image reconstructions and MIPs were obtained to evaluate the vascular anatomy. CONTRAST:  ISOVUE-370 IOPAMIDOL (ISOVUE-370)  INJECTION 76% COMPARISON:  CT angiogram chest  February 07, 2017; chest radiograph June 30, 2018 FINDINGS: Cardiovascular: There is no demonstrable pulmonary embolus. There is no thoracic aortic aneurysm or dissection. Visualized great vessels appear normal. Note that the right innominate and left common carotid arteries arise as a common trunk, an anatomic variant. There is mild cardiomegaly. There is no pericardial effusion or pericardial thickening. Mediastinum/Nodes: Thyroid appears unremarkable. There is residual thymic tissue, normal for age. There is no appreciable thoracic adenopathy. No esophageal lesions are evident. Lungs/Pleura: There are areas of mild atelectatic change. There is mild generalized pulmonary vascular prominence in the upper lobes consistent with a degree of pulmonary vascular congestion. There is no frank edema or consolidation. No pleural effusion or pleural thickening. Upper Abdomen: The spleen is atrophic and shows diffuse increased attenuation, typical appearance secondary to sickle cell disease. Visualized upper abdominal structures otherwise appear normal. Musculoskeletal: There is slight endplate irregularity in several thoracic vertebral bodies which may represent areas of early infarction. Bony structures otherwise appear unremarkable. Review of the MIP images confirms the above findings. IMPRESSION: 1. No demonstrable pulmonary embolus. No thoracic aortic aneurysm or dissection. There is a degree of pulmonary vascular congestion. 2. Areas of scattered atelectatic change. No frank edema or consolidation. 3.  No evident thoracic adenopathy. 4. Suspect early thoracic endplate infarcts at several levels. Most bony structures appear normal. 5. Atrophic hyperdense spleen, consistent with known sickle cell disease. Electronically Signed   By: Bretta Bang III M.D.   On: 06/30/2018 15:28   EKG Interpretation  Date/Time:  Thursday June 30 2018 11:29:59 EST Ventricular Rate:  80 PR Interval:    QRS Duration: 89 QT  Interval:  476 QTC Calculation: 550 R Axis:   67 Text Interpretation:  Sinus rhythm Prolonged QT interval Confirmed by Kennis Carina [id number]) on 06/30/2018 11:36:43 AM   Pending Labs Unresulted Labs (From admission, onward)    Start     Ordered   Signed and Held  HIV antibody (Routine Testing)  Once,   R     Signed and Held   Signed and Held  Comprehensive metabolic panel  Tomorrow morning,   R     Signed and Held   Signed and Held  CBC  Tomorrow morning,   R     Signed and Held          Vitals/Pain Today's Vitals   06/30/18 1547 06/30/18 1600 06/30/18 1630 06/30/18 1700  BP: 108/64 104/71 103/71 110/71  Pulse: 66 63 66 72  Resp: 14 16 17 14   Temp:      TempSrc:      SpO2: 97% 97% 93% 94%  Weight:      Height:      PainSc:        Isolation Precautions No active isolations  Medications Medications  sodium chloride (PF) 0.9 % injection (has no administration in time range)  iopamidol (ISOVUE-370) 76 % injection (has no administration in time range)  sodium chloride 0.9 % bolus 1,000 mL (0 mLs Intravenous Stopped 06/30/18 1601)  ketorolac (TORADOL) 15 MG/ML injection 15 mg (15 mg Intravenous Given 06/30/18 1455)  iopamidol (ISOVUE-370) 76 % injection 100 mL (100 mLs Intravenous Contrast Given 06/30/18 1508)  diphenhydrAMINE (BENADRYL) injection 25 mg (25 mg Intravenous Given 06/30/18 1601)  prochlorperazine (COMPAZINE) injection 10 mg (10 mg Intravenous Given 06/30/18 1601)    Mobility walks

## 2018-07-01 ENCOUNTER — Encounter: Payer: Self-pay | Admitting: Family Medicine

## 2018-07-01 DIAGNOSIS — D649 Anemia, unspecified: Secondary | ICD-10-CM | POA: Diagnosis not present

## 2018-07-01 DIAGNOSIS — D571 Sickle-cell disease without crisis: Secondary | ICD-10-CM | POA: Diagnosis not present

## 2018-07-01 DIAGNOSIS — R079 Chest pain, unspecified: Secondary | ICD-10-CM | POA: Diagnosis not present

## 2018-07-01 DIAGNOSIS — D5700 Hb-SS disease with crisis, unspecified: Secondary | ICD-10-CM | POA: Diagnosis not present

## 2018-07-01 LAB — TYPE AND SCREEN
ABO/RH(D): O POS
Antibody Screen: NEGATIVE
Unit division: 0

## 2018-07-01 LAB — CBC
HCT: 20.7 % — ABNORMAL LOW (ref 36.0–46.0)
Hemoglobin: 7.2 g/dL — ABNORMAL LOW (ref 12.0–15.0)
MCH: 33.8 pg (ref 26.0–34.0)
MCHC: 34.8 g/dL (ref 30.0–36.0)
MCV: 97.2 fL (ref 80.0–100.0)
Platelets: 378 10*3/uL (ref 150–400)
RBC: 2.13 MIL/uL — AB (ref 3.87–5.11)
RDW: 23.1 % — ABNORMAL HIGH (ref 11.5–15.5)
WBC: 8.3 10*3/uL (ref 4.0–10.5)
nRBC: 2 % — ABNORMAL HIGH (ref 0.0–0.2)

## 2018-07-01 LAB — COMPREHENSIVE METABOLIC PANEL
ALT: 13 U/L (ref 0–44)
AST: 25 U/L (ref 15–41)
Albumin: 3.8 g/dL (ref 3.5–5.0)
Alkaline Phosphatase: 51 U/L (ref 38–126)
Anion gap: 8 (ref 5–15)
BUN: 6 mg/dL (ref 6–20)
CHLORIDE: 111 mmol/L (ref 98–111)
CO2: 21 mmol/L — AB (ref 22–32)
Calcium: 8.3 mg/dL — ABNORMAL LOW (ref 8.9–10.3)
Creatinine, Ser: 0.61 mg/dL (ref 0.44–1.00)
GFR calc Af Amer: >60 mL/min (ref >60)
GFR calc non Af Amer: >60 mL/min (ref >60)
Glucose, Bld: 113 mg/dL — ABNORMAL HIGH (ref 70–99)
POTASSIUM: 3.8 mmol/L (ref 3.5–5.1)
SODIUM: 140 mmol/L (ref 135–145)
Total Bilirubin: 2 mg/dL — ABNORMAL HIGH (ref 0.3–1.2)
Total Protein: 6.8 g/dL (ref 6.5–8.1)

## 2018-07-01 LAB — BPAM RBC
Blood Product Expiration Date: 202003312359
ISSUE DATE / TIME: 202002272250
Unit Type and Rh: 9500

## 2018-07-01 NOTE — Progress Notes (Signed)
This RN agrees with the previous assessment.  

## 2018-07-01 NOTE — Discharge Instructions (Addendum)
[facility]  [phone]  Lab appointment   [facility] [phone]) for triage from 8am-12 for sickle cell day clinic.     Sickle Cell Anemia, Adult Sickle cell anemia is a condition where your red blood cells are shaped like sickles. Red blood cells carry oxygen through the body. Sickle-shaped cells do not live as long as normal red blood cells. They also clump together and block blood from flowing through the blood vessels. This prevents the body from getting enough oxygen. Sickle cell anemia causes organ damage and pain. It also increases the risk of infection. Follow these instructions at home: Medicines  Take over-the-counter and prescription medicines only as told by your doctor.  If you were prescribed an antibiotic medicine, take it as told by your doctor. Do not stop taking the antibiotic even if you start to feel better.  If you develop a fever, do not take medicines to lower the fever right away. Tell your doctor about the fever. Managing pain, stiffness, and swelling  Try these methods to help with pain: ? Use a heating pad. ? Take a warm bath. ? Distract yourself, such as by watching TV. Eating and drinking  Drink enough fluid to keep your pee (urine) clear or pale yellow. Drink more in hot weather and during exercise.  Limit or avoid alcohol.  Eat a healthy diet. Eat plenty of fruits, vegetables, whole grains, and lean protein.  Take vitamins and supplements as told by your doctor. Traveling  When traveling, keep these with you: ? Your medical information. ? The names of your doctors. ? Your medicines.  If you need to take an airplane, talk to your doctor first. Activity  Rest often.  Avoid exercises that make your heart beat much faster, such as jogging. General instructions  Do not use products that have nicotine or tobacco, such as cigarettes and e-cigarettes. If you need help quitting, ask your doctor.  Consider wearing a medical  alert bracelet.  Avoid being in high places (high altitudes), such as mountains.  Avoid very hot or cold temperatures.  Avoid places where the temperature changes a lot.  Keep all follow-up visits as told by your doctor. This is important. Contact a doctor if:  A joint hurts.  Your feet or hands hurt or swell.  You feel tired (fatigued). Get help right away if:  You have symptoms of infection. These include: ? Fever. ? Chills. ? Being very tired. ? Irritability. ? Poor eating. ? Throwing up (vomiting).  You feel dizzy or faint.  You have new stomach pain, especially on the left side.  You have a an erection (priapism) that lasts more than 4 hours.  You have numbness in your arms or legs.  You have a hard time moving your arms or legs.  You have trouble talking.  You have pain that does not go away when you take medicine.  You are short of breath.  You are breathing fast.  You have a long-term cough.  You have pain in your chest.  You have a bad headache.  You have a stiff neck.  Your stomach looks bloated even though you did not eat much.  Your skin is pale.  You suddenly cannot see well. Summary  Sickle cell anemia is a condition where your red blood cells are shaped like sickles.  Follow your doctor's advice on ways to manage pain, food to eat, activities to do, and steps to take for safe travel.  Get medical  help right away if you have any signs of infection, such as a fever. This information is not intended to replace advice given to you by your health care provider. Make sure you discuss any questions you have with your health care provider. Document Released: 02/08/2013 Document Revised: 05/26/2016 Document Reviewed: 05/26/2016 Elsevier Interactive Patient Education  2019 [employer].

## 2018-07-02 LAB — HIV ANTIBODY (ROUTINE TESTING W REFLEX): HIV Screen 4th Generation wRfx: NONREACTIVE

## 2018-07-03 DIAGNOSIS — R079 Chest pain, unspecified: Secondary | ICD-10-CM

## 2018-07-03 NOTE — Discharge Summary (Signed)
Physician Discharge Summary  Lisa Tucker YFV:[id number] DOB: 1995-09-21 DOA: 06/30/2018  PCP: Alphonse Guild, MD  Admit date: 06/30/2018  Discharge date: 07/03/2018  Discharge Diagnoses:  Active Problems:   Symptomatic anemia   Sickle cell anemia (HCC)   Discharge Condition: Stable  Disposition:  Follow-up Information    Cabarrus, Joslyn Hy, MD Follow up.   Specialty:  Cardiology Contact information: [address] [place] [address] [phone]          Pt is discharged home in good condition and is to follow up with Marga Melnick, Joslyn Hy, MD this week to have labs evaluated. Fadwa Dionicio is instructed to increase activity slowly and balance with rest for the next few days, and use prescribed medication to complete treatment of pain  Diet: Regular Wt Readings from Last 3 Encounters:  06/30/18 80.3 kg  11/12/17 82.6 kg  08/13/17 78.9 kg    History of present illness:  Lisa Tucker, a 23 year old female with a medical history significant for sickle cell anemia, type hemoglobin SS and mild intermittent asthma presented to the ER with left chest pain and shortness of breath.  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  She suddenly started having shortness of breath and increased left chest pain.  Patient also endorsed diffuse joint pain.  Patient has infrequent sickle cell pain crises.  She states that last pain crisis was in April 2019.  Patient is typically followed by hematology and consistently takes folic acid and hydroxyurea.  Patient is mostly opiate nave.  She has oxycodone 5 mg for pain control, but typically takes ibuprofen and/or Tylenol for pain control. Patient states that shortness of breath and pain have now decreased.  She endorses fatigue.  She denies fever, chills, or persistent coughing.  She also denies dysuria, nausea, vomiting, or diarrhea.  ER  course: Patient's hemoglobin is 6.7, which is below baseline.  Patient's baseline is typically 7.0-8.0 g/dL.  Troponin negative.  Chest x-ray shows no acute cardiopulmonary process.  CT angiogram shows no demonstrable pulmonary embolus.  No thoracic aortic aneurysm or dissection, patient has a degree of pulmonary vascular congestion, areas of scattered atelectatic change.  No evidence of thoracic adenopathy, suspect early thoracic endplate infarction at several levels.  Most bony structures appear normal, atrophic hypertensive spleen, consistent with known sickle cell disease.  Pain resolved with supplemental oxygen, IV Toradol, IV fluids, IV Benadryl, and IV Compazine.  Hemoglobin 5.5, which is below baseline.  Transfuse 1 unit of packed red blood cells.  Will admit overnight for observation for symptomatic anemia.  Hospital Course:  Sickle cell anemia with pain crisis: Patient was admitted for sickle cell pain crisis and managed appropriately with IVF, oxycodone 5 mg by mouth and IV Toradol, as well as other adjunct therapies per sickle cell pain management protocols.  Pain resolved.  Also, folic acid and hydroxyurea were continued throughout admission without interruption.  Symptomatic anemia: Hemoglobin 5.5, which was below patient's baseline.  Patient was transfused 1 unit of packed red blood cells.  Hemoglobin improved to 7.2, which is consistent with baseline.  Patient advised to follow-up with patient care center in 1 week to repeat CBC.  Patient expressed understanding.  Patient is not having pain at present.  Patient alert, oriented, and ambulating without assistance.  Patient afebrile and oxygen saturation greater than 90% on RA.  Patient was discharged home today with mother in a hemodynamically  stable condition.   Discharge Exam: Vitals:   07/01/18 0837 07/01/18 1033  BP:  103/62  Pulse:  67  Resp:  14  Temp:  98 F (36.7 C)  SpO2: 99% 92%   Vitals:   07/01/18 0125 07/01/18  0644 07/01/18 0837 07/01/18 1033  BP: (!) 102/55 (!) 102/58  103/62  Pulse: (!) 56 (!) 51  67  Resp: Temp: 97.8 F (36.6 C) 98.1 F (36.7 C)  98 F (36.7 C)  TempSrc: Oral Oral  Oral  SpO2: 98% 100% 99% 92%  Weight:      Height:        General appearance : Awake, alert, not in any distress. Speech Clear. Not toxic looking HEENT: Atraumatic and Normocephalic, pupils equally reactive to light and accomodation Neck: Supple, no JVD. No cervical lymphadenopathy.  Chest: Good air entry bilaterally, no added sounds  CVS: S1 S2 regular, no murmurs.  Abdomen: Bowel sounds present, Non tender and not distended with no gaurding, rigidity or rebound. Extremities: B/L Lower Ext shows no edema, both legs are warm to touch Neurology: Awake alert, and oriented X 3, CN II-XII intact, Non focal Skin: No Rash  Discharge Instructions  Discharge Instructions    Discharge patient   Complete by:  As directed    Discharge disposition:  01-Home or Self Care   Discharge patient date:  07/01/2018     Allergies as of 07/01/2018   No Known Allergies     Medication List    TAKE these medications   albuterol 108 (90 Base) MCG/ACT inhaler Commonly known as:  PROVENTIL HFA;VENTOLIN HFA Inhale 2 puffs into the lungs every 6 (six) hours as needed for wheezing or shortness of breath.   beclomethasone 80 MCG/ACT inhaler Commonly known as:  QVAR Inhale 2 puffs into the lungs 2 (two) times daily.   etonogestrel 68 MG Impl implant Commonly known as:  NEXPLANON 1 each by Subdermal route once.   folic acid 1 MG tablet Commonly known as:  FOLVITE Take 1 mg by mouth daily.   hydroxyurea 500 MG capsule Commonly known as:  HYDREA Take 2,500 mg by mouth at bedtime.   montelukast 10 MG tablet Commonly known as:  SINGULAIR Take 10 mg by mouth daily.   oxyCODONE 5 MG immediate release tablet Commonly known as:  Oxy IR/ROXICODONE Take 10 mg by mouth every 4 (four) hours as needed.    penicillin v potassium 250 MG tablet Commonly known as:  VEETID Take 250 mg by mouth 2 (two) times daily.   Vitamin D3 25 MCG (1000 UT) Caps Take 1,000 Units by mouth daily.       The results of significant diagnostics from this hospitalization (including imaging, microbiology, ancillary and laboratory) are listed below for reference.    Significant Diagnostic Studies: Dg Chest 2 View  Result Date: 06/30/2018 CLINICAL DATA:  Chest pain and shortness of breath. History of sickle cell anemia. EXAM: CHEST - 2 VIEW COMPARISON:  11/12/2017 FINDINGS: The cardiac silhouette remains mildly enlarged. The lungs are well inflated without evidence of airspace consolidation, edema, pleural effusion, or pneumothorax. No acute osseous abnormality is identified. IMPRESSION: No active cardiopulmonary disease. Electronically Signed   By: Sebastian Ache M.D.   On: 06/30/2018 12:05   Ct Angio Chest Pe W Or Wo Contrast  Result Date: 06/30/2018 CLINICAL DATA:  Shortness of breath and chest tightness. Sickle cell disease EXAM: CT ANGIOGRAPHY CHEST WITH CONTRAST TECHNIQUE: Multidetector CT imaging of the  chest was performed using the standard protocol during bolus administration of intravenous contrast. Multiplanar CT image reconstructions and MIPs were obtained to evaluate the vascular anatomy. CONTRAST:  ISOVUE-370 IOPAMIDOL (ISOVUE-370) INJECTION 76% COMPARISON:  CT angiogram chest February 07, 2017; chest radiograph June 30, 2018 FINDINGS: Cardiovascular: There is no demonstrable pulmonary embolus. There is no thoracic aortic aneurysm or dissection. Visualized great vessels appear normal. Note that the right innominate and left common carotid arteries arise as a common trunk, an anatomic variant. There is mild cardiomegaly. There is no pericardial effusion or pericardial thickening. Mediastinum/Nodes: Thyroid appears unremarkable. There is residual thymic tissue, normal for age. There is no appreciable  thoracic adenopathy. No esophageal lesions are evident. Lungs/Pleura: There are areas of mild atelectatic change. There is mild generalized pulmonary vascular prominence in the upper lobes consistent with a degree of pulmonary vascular congestion. There is no frank edema or consolidation. No pleural effusion or pleural thickening. Upper Abdomen: The spleen is atrophic and shows diffuse increased attenuation, typical appearance secondary to sickle cell disease. Visualized upper abdominal structures otherwise appear normal. Musculoskeletal: There is slight endplate irregularity in several thoracic vertebral bodies which may represent areas of early infarction. Bony structures otherwise appear unremarkable. Review of the MIP images confirms the above findings. IMPRESSION: 1. No demonstrable pulmonary embolus. No thoracic aortic aneurysm or dissection. There is a degree of pulmonary vascular congestion. 2. Areas of scattered atelectatic change. No frank edema or consolidation. 3.  No evident thoracic adenopathy. 4. Suspect early thoracic endplate infarcts at several levels. Most bony structures appear normal. 5. Atrophic hyperdense spleen, consistent with known sickle cell disease. Electronically Signed   By: Bretta Bang III M.D.   On: 06/30/2018 15:28    Microbiology: No results found for this or any previous visit (from the past 240 hour(s)).   Labs: Basic Metabolic Panel: Recent Labs  Lab 06/30/18 1233 07/01/18 0403  NA 137 140  K 3.9 3.8  CL 107 111  CO2 21* 21*  GLUCOSE 91 113*  BUN 5* 6  CREATININE 0.56 0.61  CALCIUM 8.9 8.3*   Liver Function Tests: Recent Labs  Lab 07/01/18 0403  AST 25  ALT 13  ALKPHOS 51  BILITOT 2.0*  PROT 6.8  ALBUMIN 3.8   No results for input(s): LIPASE, AMYLASE in the last 168 hours. No results for input(s): AMMONIA in the last 168 hours. CBC: Recent Labs  Lab 06/30/18 1233 07/01/18 0403  WBC 9.8 8.3  NEUTROABS 5.7  --   HGB 6.7* 7.2*  HCT  18.7* 20.7*  MCV 96.9 97.2  PLT 475* 378   Cardiac Enzymes: No results for input(s): CKTOTAL, CKMB, CKMBINDEX, TROPONINI in the last 168 hours. BNP: Invalid input(s): POCBNP CBG: No results for input(s): GLUCAP in the last 168 hours.  Time coordinating discharge: 50 minutes  Signed:  Nolon Nations  APRN, MSN, FNP-C [facility] [address] [place], [place] [address] [phone]  [facility] 07/03/2018, 1:56 PM

## 2018-10-11 IMAGING — CR DG CHEST 2V
2 series · 2 of 2 positions shown · non-contrast
Comparison: 06/11/2016 chest radiograph.

CLINICAL DATA: Cough, sickle cell anemia

EXAM:
CHEST  2 VIEW

[w chest lat]
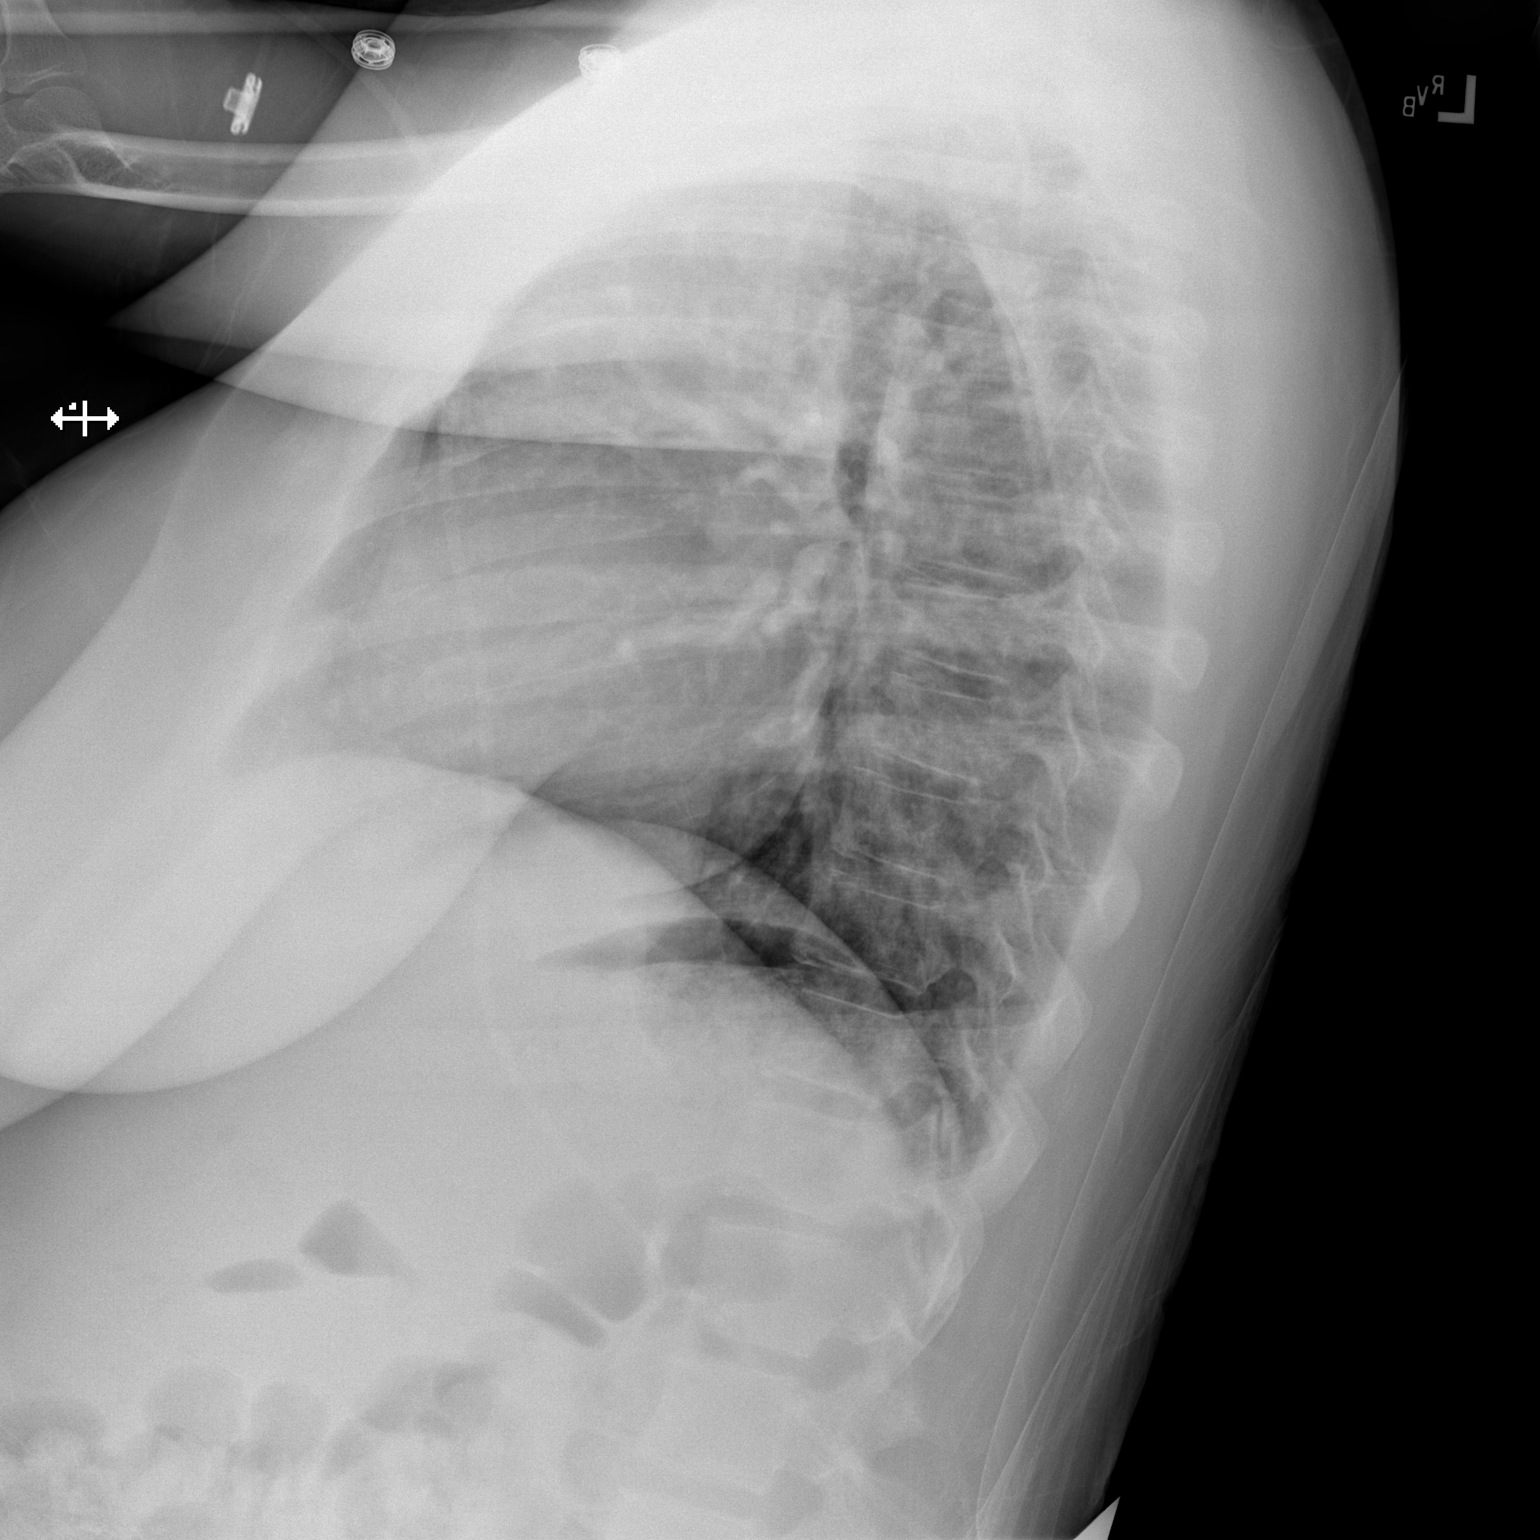

[x chest ap]
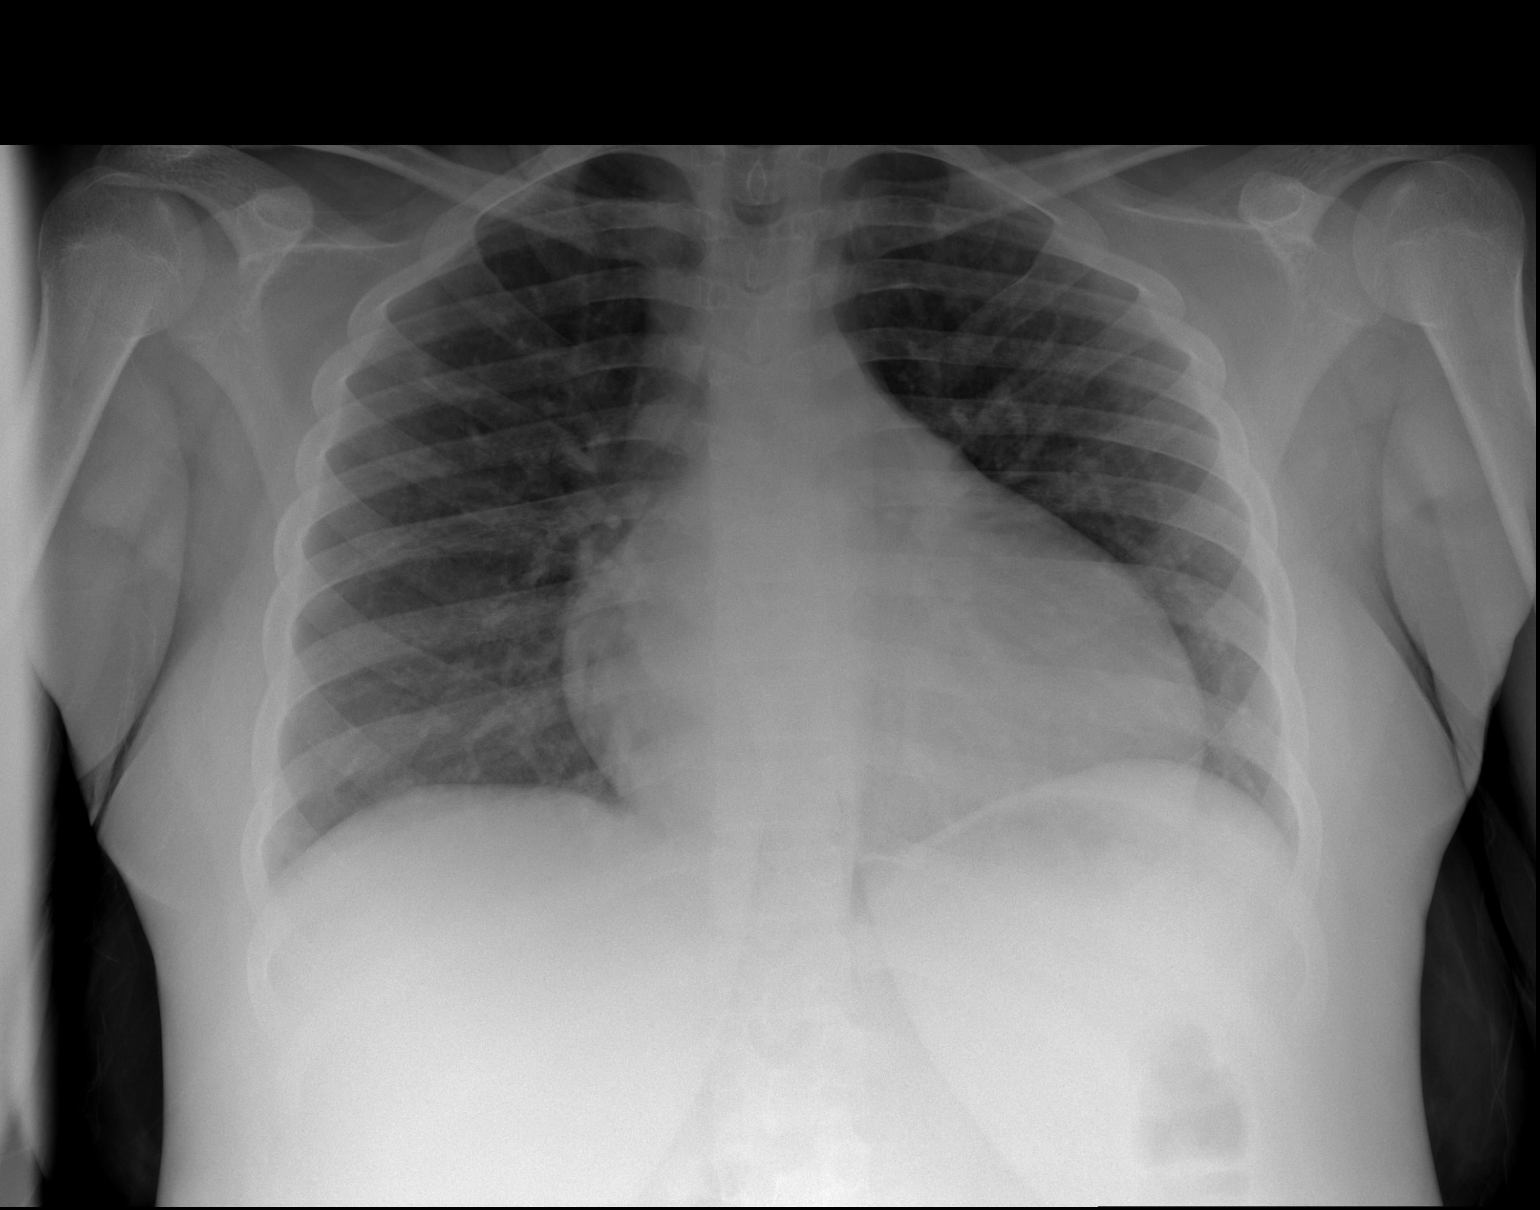

[2 of 2 positions shown; findings below may reference images not displayed]

FINDINGS: New enlargement of the cardiopericardial silhouette. Otherwise
normal mediastinal contour. No pneumothorax. No pleural effusion. No
acute consolidative airspace disease. No overt pulmonary edema.
Visualized osseous structures appear intact.
IMPRESSION: New enlargement of the cardiopericardial silhouette, cannot exclude
a pericardial effusion. No active pulmonary disease.

## 2018-11-15 IMAGING — CT CT ANGIO CHEST
2 of 6 series · 18 of 36 positions shown · IV contrast (ISOVUE 370)
Comparison: Chest radiograph 02/07/2017

CLINICAL DATA: Sickle cell anemia.  Hypoxia.  Headache.

EXAM:
CT ANGIOGRAPHY CHEST WITH CONTRAST
TECHNIQUE: Multidetector CT imaging of the chest was performed using the
standard protocol during bolus administration of intravenous
contrast. Multiplanar CT image reconstructions and MIPs were
obtained to evaluate the vascular anatomy.
CONTRAST:  100 mL Isovue 370

[Series 5: coronal mpr · coronal · 0.45mm/px · 1 of 151 slices shown]
[im 76/151  mediastinal]
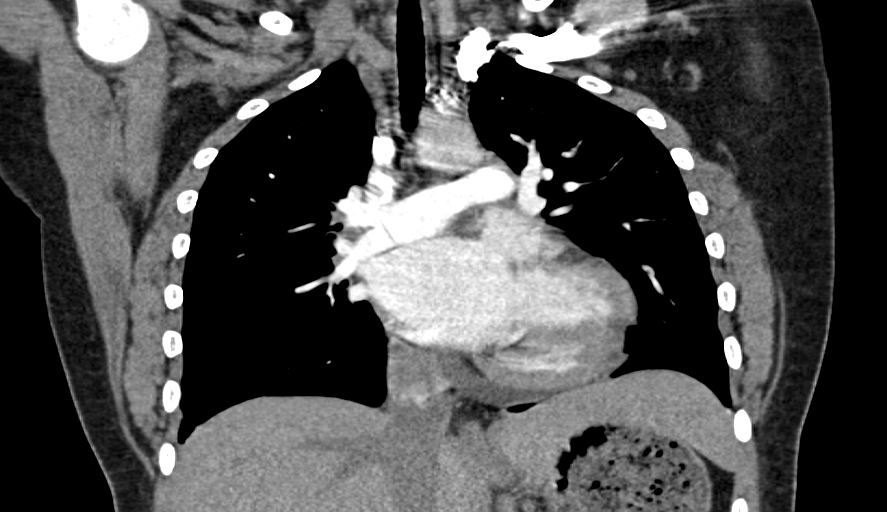

[Series 10: thins for pacs · axial · 0.74mm/px · z∈[+136,+344]mm · 17 of 232 slices shown]
[im 12/232  lung]
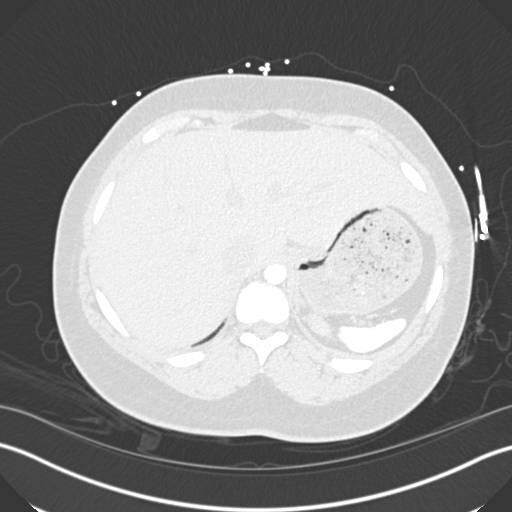
[im 24/232  mediastinal]
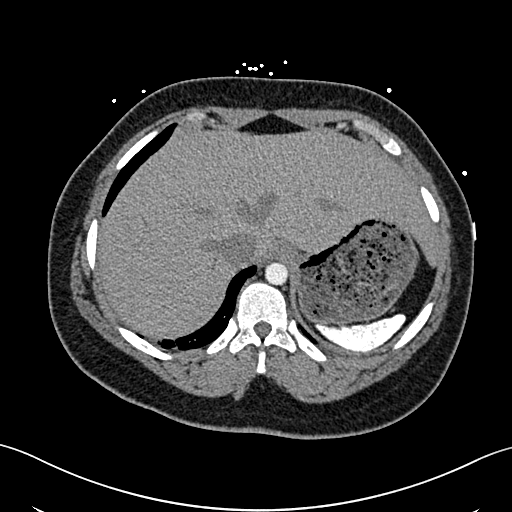
[im 35/232  lung]
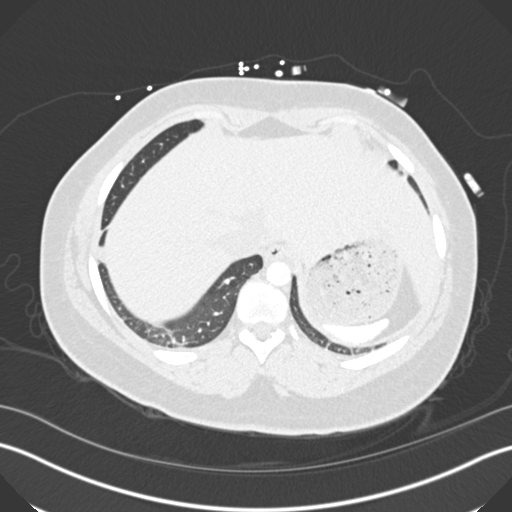
[im 47/232  mediastinal]
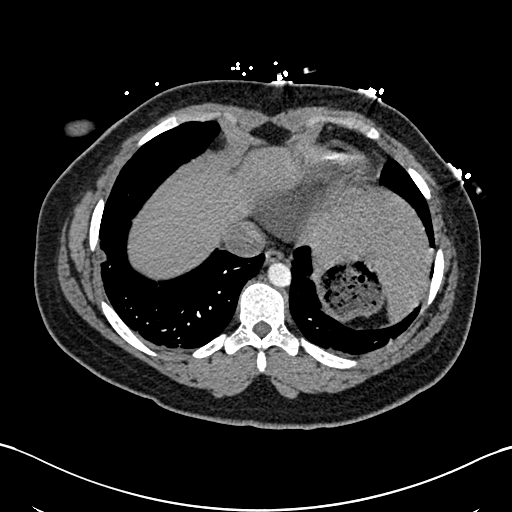
[im 70/232  lung]
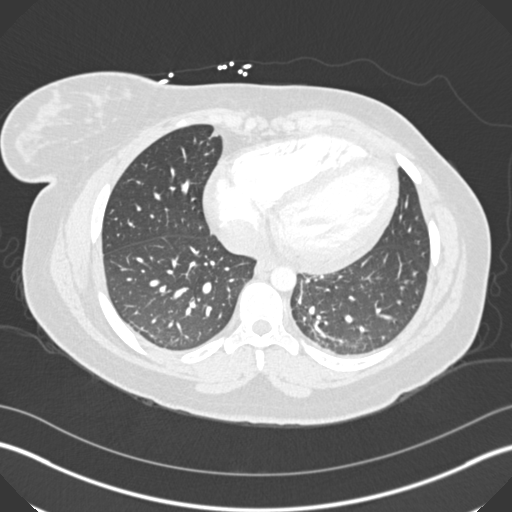
[im 81/232  mediastinal]
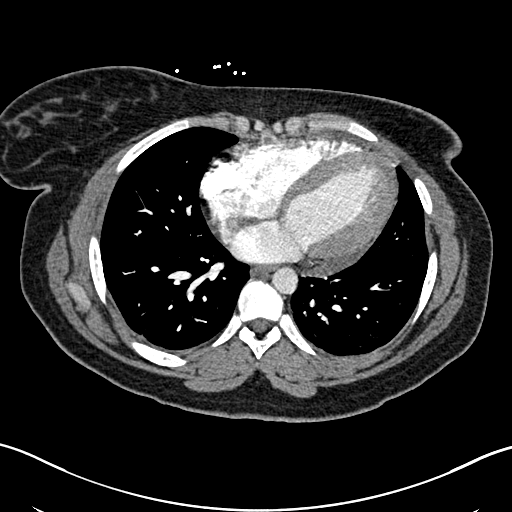
[im 93/232  lung]
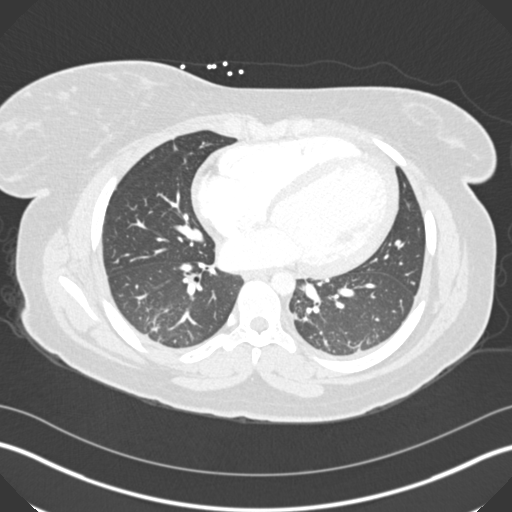
[im 104/232  mediastinal]
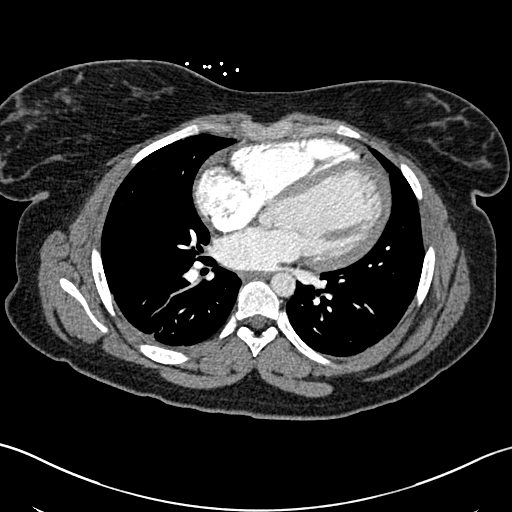
[im 116/232  lung]
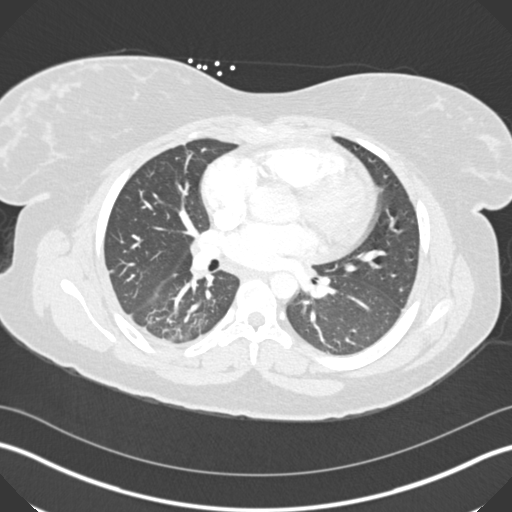
[im 128/232  mediastinal]
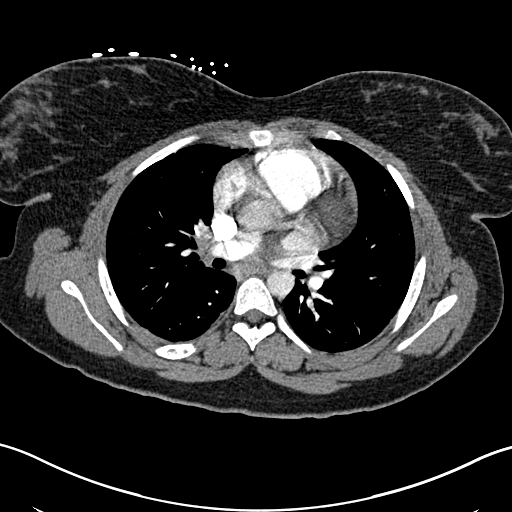
[im 139/232  lung]
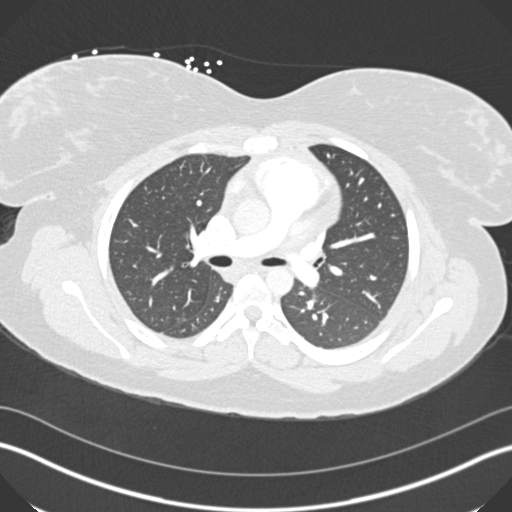
[im 151/232  mediastinal]
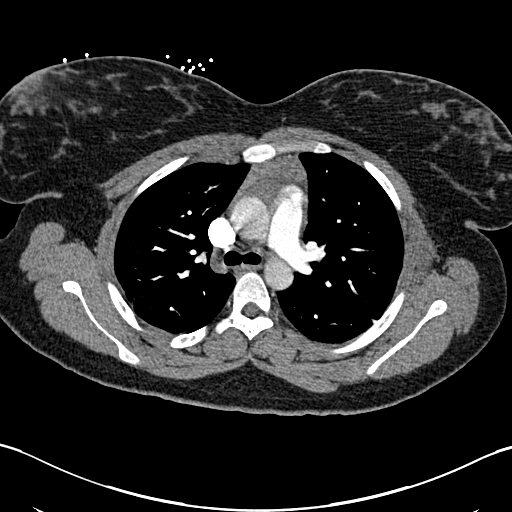
[im 162/232  lung]
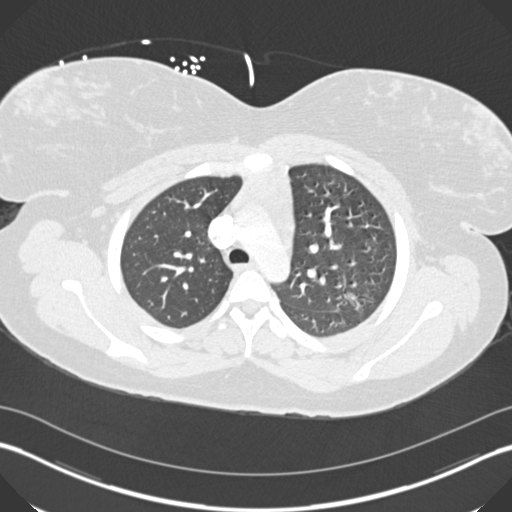
[im 185/232  mediastinal]
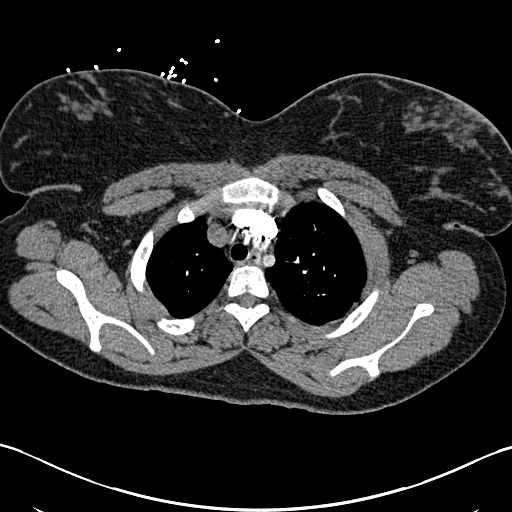
[im 197/232  lung]
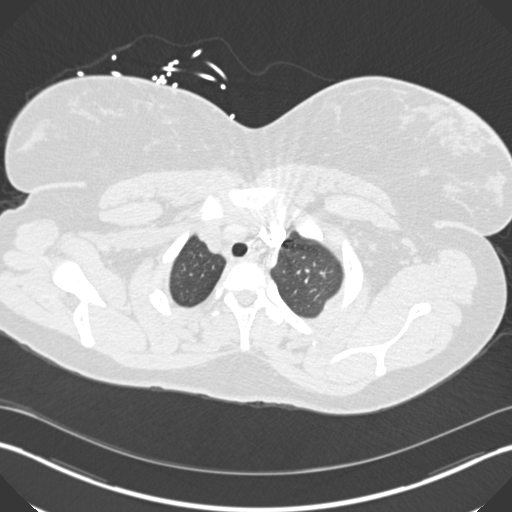
[im 208/232  mediastinal]
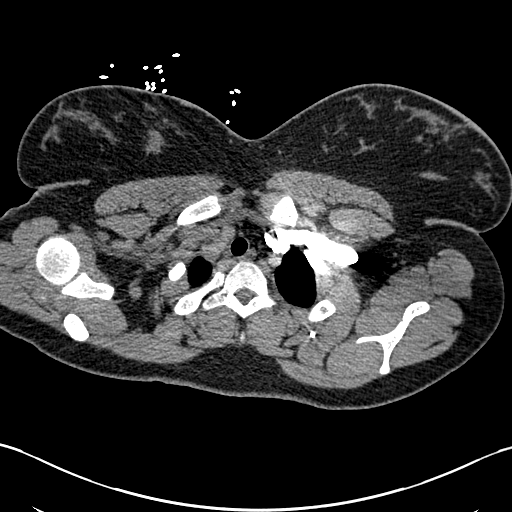
[im 220/232  lung]
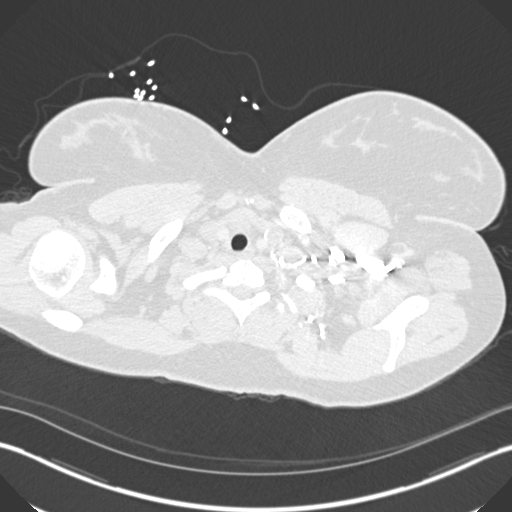

[18 of 36 positions shown; findings below may reference images not displayed]

FINDINGS: Cardiovascular: Contrast injection is sufficient to demonstrate
satisfactory opacification of the pulmonary arteries to the
segmental level. There is no pulmonary embolus. The main pulmonary
artery is within normal limits for size. There is no CT evidence of
acute right heart strain. The visualized aorta is normal. There is a
normal 3-vessel arch branching pattern. Heart size is enlarged
without pericardial effusion.

Mediastinum/Nodes: No mediastinal, hilar or axillary
lymphadenopathy. The visualized thyroid and thoracic esophageal
course are unremarkable.

Lungs/Pleura: No pulmonary nodules or masses. No pleural effusion or
pneumothorax. No focal airspace consolidation. No focal pleural
abnormality.

Upper Abdomen: Contrast bolus timing is not optimized for evaluation
of the abdominal organs. Shrunken, hyperdense spleen.

Musculoskeletal: No chest wall abnormality. No acute or significant
osseous findings.

Review of the MIP images confirms the above findings.
IMPRESSION: No pulmonary embolus or other acute thoracic abnormality.

## 2019-07-13 ENCOUNTER — Ambulatory Visit: Payer: Medicaid Other | Attending: Internal Medicine

## 2019-07-13 DIAGNOSIS — Z23 Encounter for immunization: Secondary | ICD-10-CM

## 2019-07-13 NOTE — Progress Notes (Signed)
   Covid-19 Vaccination Clinic  Name:  Lisa Tucker    MRN: [id number] DOB: 1996-03-23  07/13/2019  Lisa Tucker was observed post Covid-19 immunization for 15 minutes without incident. She was provided with Vaccine Information Sheet and instruction to access the V-Safe system.   Lisa Tucker was instructed to call 911 with any severe reactions post vaccine: Marland Kitchen Difficulty breathing  . Swelling of face and throat  . A fast heartbeat  . A bad rash all over body  . Dizziness and weakness   Immunizations Administered    Name Date Dose VIS Date Route   Pfizer COVID-19 Vaccine 07/13/2019  9:07 AM 0.3 mL 04/14/2019 Intramuscular   Manufacturer: [employer], [employer]   Lot: [id number]   NDC: [address]-6

## 2019-08-07 ENCOUNTER — Ambulatory Visit: Payer: Medicaid Other | Attending: Internal Medicine

## 2019-08-07 DIAGNOSIS — Z23 Encounter for immunization: Secondary | ICD-10-CM

## 2019-08-07 NOTE — Progress Notes (Signed)
   Covid-19 Vaccination Clinic  Name:  Hooria Gasparini    MRN: [id number] DOB: [birth year]  08/07/2019  Ms. Haider was observed post Covid-19 immunization for 15 minutes without incident. She was provided with Vaccine Information Sheet and instruction to access the V-Safe system.   Ms. Pirie was instructed to call 911 with any severe reactions post vaccine: Marland Kitchen Difficulty breathing  . Swelling of face and throat  . A fast heartbeat  . A bad rash all over body  . Dizziness and weakness   Immunizations Administered    Name Date Dose VIS Date Route   Pfizer COVID-19 Vaccine 08/07/2019  8:40 AM 0.3 mL 04/14/2019 Intramuscular   Manufacturer: [employer], [employer]   Lot: [id number]   NDC: [address]-0

## 2019-08-07 NOTE — Progress Notes (Signed)
   Covid-19 Vaccination Clinic  Name:  Lisa Tucker    MRN: [id number] DOB: 08/07/1995  08/07/2019  [name] was observed post Covid-19 immunization for 15 minutes without incident. She was provided with Vaccine Information Sheet and instruction to access the V-Safe system.   [name] was instructed to call 911 with any severe reactions post vaccine: . Difficulty breathing  . Swelling of face and throat  . A fast heartbeat  . A bad rash all over body  . Dizziness and weakness   Immunizations Administered    Name Date Dose VIS Date Route   Pfizer COVID-19 Vaccine 08/07/2019  8:40 AM 0.3 mL 04/14/2019 Intramuscular   Manufacturer: Pfizer[employer] Inc   Lot: [id number]   NDC: [address]-2     

## 2019-10-03 ENCOUNTER — Encounter (HOSPITAL_COMMUNITY): Payer: Self-pay | Admitting: Emergency Medicine

## 2019-10-03 ENCOUNTER — Emergency Department (HOSPITAL_COMMUNITY): Payer: Medicaid Other

## 2019-10-03 ENCOUNTER — Emergency Department (HOSPITAL_COMMUNITY)
Admission: EM | Admit: 2019-10-03 | Discharge: 2019-10-03 | Disposition: A | Discharge status: Home / Self Care | Payer: Medicaid Other | Attending: Emergency Medicine | Admitting: Emergency Medicine

## 2019-10-03 ENCOUNTER — Other Ambulatory Visit: Payer: Self-pay

## 2019-10-03 DIAGNOSIS — R000 Tachycardia, unspecified: Secondary | ICD-10-CM | POA: Insufficient documentation

## 2019-10-03 DIAGNOSIS — R0789 Other chest pain: Secondary | ICD-10-CM | POA: Insufficient documentation

## 2019-10-03 DIAGNOSIS — M7918 Myalgia, other site: Secondary | ICD-10-CM | POA: Diagnosis not present

## 2019-10-03 DIAGNOSIS — D571 Sickle-cell disease without crisis: Secondary | ICD-10-CM | POA: Insufficient documentation

## 2019-10-03 DIAGNOSIS — Z79899 Other long term (current) drug therapy: Secondary | ICD-10-CM | POA: Insufficient documentation

## 2019-10-03 DIAGNOSIS — R079 Chest pain, unspecified: Secondary | ICD-10-CM

## 2019-10-03 LAB — BASIC METABOLIC PANEL
Anion gap: 11 (ref 5–15)
BUN: 6 mg/dL (ref 6–20)
CO2: 20 mmol/L — ABNORMAL LOW (ref 22–32)
Calcium: 8.9 mg/dL (ref 8.9–10.3)
Chloride: 107 mmol/L (ref 98–111)
Creatinine, Ser: 0.54 mg/dL (ref 0.44–1.00)
GFR calc Af Amer: >60 mL/min (ref >60)
GFR calc non Af Amer: >60 mL/min (ref >60)
Glucose, Bld: 110 mg/dL — ABNORMAL HIGH (ref 70–99)
Potassium: 3.8 mmol/L (ref 3.5–5.1)
Sodium: 138 mmol/L (ref 135–145)

## 2019-10-03 LAB — CBC
HCT: 21.9 % — ABNORMAL LOW (ref 36.0–46.0)
Hemoglobin: 7.7 g/dL — ABNORMAL LOW (ref 12.0–15.0)
MCH: 33 pg (ref 26.0–34.0)
MCHC: 35.2 g/dL (ref 30.0–36.0)
MCV: 94 fL (ref 80.0–100.0)
Platelets: 518 10*3/uL — ABNORMAL HIGH (ref 150–400)
RBC: 2.33 MIL/uL — ABNORMAL LOW (ref 3.87–5.11)
RDW: 23.5 % — ABNORMAL HIGH (ref 11.5–15.5)
WBC: 10.1 10*3/uL (ref 4.0–10.5)
nRBC: 1.4 % — ABNORMAL HIGH (ref 0.0–0.2)

## 2019-10-03 LAB — TROPONIN I (HIGH SENSITIVITY)
Troponin I (High Sensitivity): 3 ng/L (ref <18)
Troponin I (High Sensitivity): <2 ng/L (ref <18)

## 2019-10-03 LAB — D-DIMER, QUANTITATIVE: D-Dimer, Quant: 1.84 ug/mL-FEU — ABNORMAL HIGH (ref 0.00–0.50)

## 2019-10-03 MED ORDER — HYDROCODONE-ACETAMINOPHEN 5-325 MG PO TABS
1.0000 | ORAL_TABLET | Freq: Once | ORAL | Status: AC
  Administered 2019-10-03: 1 via ORAL
  Filled 2019-10-03: qty 1

## 2019-10-03 MED ORDER — IOHEXOL 350 MG/ML SOLN
100.0000 mL | Freq: Once | INTRAVENOUS | Status: AC | PRN
  Administered 2019-10-03: 73 mL via INTRAVENOUS

## 2019-10-03 NOTE — Discharge Instructions (Signed)
Follow-up with your primary care provider.  Return to ER for new or worsening symptoms. 

## 2019-10-03 NOTE — ED Notes (Signed)
BhCG <5. ISTAT not crossing over.  

## 2019-10-03 NOTE — ED Notes (Signed)
Discharge instructions discussed with pt. Pt verbalized understanding with no questions at this time.  

## 2019-10-03 NOTE — ED Provider Notes (Signed)
[facility] EMERGENCY DEPARTMENT Provider Note   CSN: [id number] Arrival date & time: 10/03/19  1448     History Chief Complaint  Patient presents with  . Chest Pain    Lisa Tucker is a 24 y.o. female.  23yo female with history of sickle cell anemia, prior pericardial effusion with complaint of right side chest pain onset yesterday, constant, sharp in nature, worse with turning head to left/stretching shoulder. Denies associated abdominal pain, SHOB, fever, cough, diaphoresis, nausea/vomiting. 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  Prior acute chest syndrome 3-4 years ago, hospitalized at [facility] in [place] (2016), states does not feel similar.  No fevers No history of PE/DVT. Non smoker, no lower extremity swelling. Nexplanon in arm, no oral hormone therapy.      HPI: A 24 year old patient with a history of obesity presents for evaluation of chest pain. Initial onset of pain was more than 6 hours ago. The patient's chest pain is sharp and is not worse with exertion. The patient's chest pain is not middle- or left-sided, is not well-localized, is not described as heaviness/pressure/tightness and does not radiate to the arms/jaw/neck. The patient does not complain of nausea and denies diaphoresis. The patient has no history of stroke, has no history of peripheral artery disease, has not smoked in the past 90 days, denies any history of treated diabetes, has no relevant family history of coronary artery disease (first degree relative at less than age 71), is not hypertensive and has no history of hypercholesterolemia.   Past Medical History:  Diagnosis Date  . Asthma   . Pericardial effusion 2015  . Sickle cell anemia (HCC)    SS disease; has required [place] O[place] for several months twice in the past (2013, 2017)    Patient  Active Problem List   Diagnosis Date Noted  . Chest pain   . Anemia 06/30/2018  . Sickle cell anemia (HCC) 06/30/2018  . Sickle cell anemia with crisis (HCC) 08/12/2017  . SIRS (systemic inflammatory response syndrome) (HCC) 08/12/2017  . Leucocytosis 08/12/2017  . Fever 08/12/2017  . Asthma 08/12/2017  . Anemia of chronic disease 01/04/2017    Past Surgical History:  Procedure Laterality Date  . APPENDECTOMY  2011     OB History   No obstetric history on file.     Family History  Problem Relation Age of Onset  . Hypertension Mother     Social History   Tobacco Use  . Smoking status: Never Smoker  . Smokeless tobacco: Never Used  Substance Use Topics  . Alcohol use: No  . Drug use: No    Home Medications Prior to Admission medications   Medication Sig Start Date End Date Taking? Authorizing Provider  albuterol (PROVENTIL HFA;VENTOLIN HFA) 108 (90 Base) MCG/ACT inhaler Inhale 2 puffs into the lungs every 6 (six) hours as needed for wheezing or shortness of breath.     [provider]  beclomethasone (QVAR) 80 MCG/ACT inhaler Inhale 2 puffs into the lungs 2 (two) times daily.    [provider]  Cholecalciferol (VITAMIN D3) 25 MCG (1000 UT) CAPS Take 1,000 Units by mouth daily.    [provider]  etonogestrel (NEXPLANON) 68 MG IMPL implant 1 each by Subdermal route once.    [provider]  folic acid (FOLVITE) 1 MG tablet Take 1 mg  by mouth daily.    [provider]  hydroxyurea (HYDREA) 500 MG capsule Take 2,500 mg by mouth at bedtime.     [provider]  montelukast (SINGULAIR) 10 MG tablet Take 10 mg by mouth daily.    [provider]  oxyCODONE (OXY IR/ROXICODONE) 5 MG immediate release tablet Take 10 mg by mouth every 4 (four) hours as needed. 01/25/18   [provider]  penicillin v potassium (VEETID) 250 MG tablet Take 250 mg by mouth 2 (two) times daily.    [provider]     Allergies    Patient has no known allergies.  Review of Systems   Review of Systems  Constitutional: Negative for fever.  Respiratory: Negative for cough and shortness of breath.   Cardiovascular: Positive for chest pain and palpitations.  Gastrointestinal: Negative for abdominal pain, nausea and vomiting.  Musculoskeletal: Positive for myalgias. Negative for arthralgias.  Skin: Negative for rash and wound.  Neurological: Negative for weakness.  All other systems reviewed and are negative.   Physical Exam Updated Vital Signs BP 101/65   Pulse 74   Temp 98.7 F (37.1 C) (Oral)   Resp 16   Ht 5\' 4"  (1.626 m)   Wt 82.6 kg   SpO2 96%   BMI 31.24 kg/m   Physical Exam Vitals and nursing note reviewed.  Constitutional:      General: She is not in acute distress.    Appearance: She is well-developed. She is not diaphoretic.  HENT:     Head: Normocephalic and atraumatic.  Cardiovascular:     Rate and Rhythm: Normal rate and regular rhythm.     Heart sounds: Normal heart sounds.     Comments: Heart rate currently 84 regular rate and rhythm Pulmonary:     Effort: Pulmonary effort is normal.     Breath sounds: Normal breath sounds.  Chest:     Chest wall: No tenderness.  Abdominal:     Palpations: Abdomen is soft.     Tenderness: There is no abdominal tenderness.  Musculoskeletal:     Right lower leg: No edema.     Left lower leg: No edema.  Skin:    General: Skin is warm and dry.     Findings: No erythema or rash.  Neurological:     Mental Status: She is alert and oriented to person, place, and time.  Psychiatric:        Behavior: Behavior normal.     ED Results / Procedures / Treatments   Labs (all labs ordered are listed, but only abnormal results are displayed) Labs Reviewed  BASIC METABOLIC PANEL - Abnormal; Notable for the following components:      Result Value   CO2 20 (*)    Glucose, Bld 110 (*)    All other components within normal limits  CBC  - Abnormal; Notable for the following components:   RBC 2.33 (*)    Hemoglobin 7.7 (*)    HCT 21.9 (*)    RDW 23.5 (*)    Platelets 518 (*)    nRBC 1.4 (*)    All other components within normal limits  D-DIMER, QUANTITATIVE (NOT AT [facility]) - Abnormal; Notable for the following components:   D-Dimer, Quant 1.84 (*)    All other components within normal limits  I-STAT BETA HCG BLOOD, ED ([facility], [facility], [facility] ONLY)  TROPONIN I (HIGH SENSITIVITY)  TROPONIN I (HIGH SENSITIVITY)    EKG EKG Interpretation  Date/Time:  Tuesday  October 03 2019 14:56:55 EDT Ventricular Rate:  99 PR Interval:  148 QRS Duration: 76 QT Interval:  358 QTC Calculation: 459 R Axis:   54 Text Interpretation: Normal sinus rhythm Normal ECG Confirmed by Tilden Fossa [id number]) on 10/03/2019 6:05:43 PM   Radiology DG Chest 2 View  Result Date: 10/03/2019 CLINICAL DATA:  Chest pain. EXAM: CHEST - 2 VIEW COMPARISON:  06/30/2018 FINDINGS: Midline trachea. Mild cardiomegaly. No pleural effusion or pneumothorax. Clear lungs. IMPRESSION: Mild cardiomegaly, without acute disease. Electronically Signed   By: Jeronimo Greaves M.D.   On: 10/03/2019 15:31   CT Angio Chest PE W/Cm &/Or Wo Cm  Result Date: 10/03/2019 CLINICAL DATA:  24 year old female with chest pain. EXAM: CT ANGIOGRAPHY CHEST WITH CONTRAST TECHNIQUE: Multidetector CT imaging of the chest was performed using the standard protocol during bolus administration of intravenous contrast. Multiplanar CT image reconstructions and MIPs were obtained to evaluate the vascular anatomy. CONTRAST:  54mL OMNIPAQUE IOHEXOL 350 MG/ML SOLN COMPARISON:  Chest CT dated 06/30/2018. FINDINGS: Cardiovascular: There is mild cardiomegaly. No pericardial effusion. The thoracic aorta is unremarkable. The origins of the great vessels of the aortic arch appear patent. No pulmonary artery embolus identified. Mediastinum/Nodes: There is no hilar or mediastinal adenopathy. The esophagus and the thyroid gland are  grossly unremarkable. Anterior mediastinal fluid density, likely pericardial recess. Lungs/Pleura: Minimal bibasilar atelectasis/scarring. No focal consolidation, or pneumothorax. Trace pleural effusions may be present. The central airways are patent. Upper Abdomen: No acute abnormality. Musculoskeletal: No chest wall abnormality. No acute or significant osseous findings. Review of the MIP images confirms the above findings. IMPRESSION: 1. No acute intrathoracic pathology. No CT evidence of pulmonary embolism. 2. Mild cardiomegaly. Electronically Signed   By: Elgie Collard M.D.   On: 10/03/2019 21:19    Procedures Procedures (including critical care time)  Medications Ordered in ED Medications  HYDROcodone-acetaminophen (NORCO/VICODIN) 5-325 MG per tablet 1 tablet (has no administration in time range)  iohexol (OMNIPAQUE) 350 MG/ML injection 100 mL (73 mLs Intravenous Contrast Given 10/03/19 2104)    ED Course  I have reviewed the triage vital signs and the nursing notes.  Pertinent labs & imaging results that were available during my care of the patient were reviewed by me and considered in my medical decision making (see chart for details).  Clinical Course as of Oct 03 2143  Tue Oct 03, 2019  Lavella Hammock' score 1.5, low risk for PE.    [LM]  4752 24 year old female with history of sickle cell anemia presents with complaint of right-sided chest pain with episode of tachycardia earlier today.  Patient states symptoms not similar with prior sickle cell crisis, has had acute chest syndrome in the past, symptoms not consistent with that either.  No history of PE or DVT, denies leg swelling, shortness of breath, is a non-smoker, not on oral hormone therapy. Labs reassuring including CBC with baseline anemia with hemoglobin of 7.7.  BMP with bicarb of 20, normal gap.  Troponin is 2.  Patient's D-dimer is elevated, will follow with CT chest and due to tachycardia earlier today. CT chest is negative  for PE.  Chest x-ray and CT do show mild cardiomegaly. Discussed results with patient, recommend follow-up with PCP.  Will give hydrocodone for her pain today.  Advised return to ER for new or worsening symptoms.   [LM]    Clinical Course User Index [LM] Alden Hipp   MDM Rules/Calculators/A&P HEAR Score: 1  Final Clinical Impression(s) / ED Diagnoses Final diagnoses:  Chest pain, unspecified type  Tachycardia    Rx / DC Orders ED Discharge Orders    None       Roque Lias 10/03/19 2145    Quintella Reichert, MD 10/04/19 1123

## 2019-10-03 NOTE — ED Notes (Signed)
Pt ambulated to restroom with steady gait.

## 2019-10-03 NOTE — ED Triage Notes (Signed)
Pt endorses central and right sided chest pain since last night. Denies SOB.

## 2019-10-04 LAB — I-STAT BETA HCG BLOOD, ED (MC, WL, AP ONLY): I-stat hCG, quantitative: <5 m[IU]/mL (ref <5)
